# Patient Record
Sex: Female | Born: 1965 | Race: White | Hispanic: No | Marital: Married | Smoking: Former smoker
Health system: Southern US, Community
[De-identification: ages and names within clinical notes are randomized; demographics above are authoritative.]

## PROBLEM LIST (undated history)

## (undated) DIAGNOSIS — R112 Nausea with vomiting, unspecified: Secondary | ICD-10-CM

## (undated) DIAGNOSIS — Z9889 Other specified postprocedural states: Secondary | ICD-10-CM

## (undated) DIAGNOSIS — K219 Gastro-esophageal reflux disease without esophagitis: Secondary | ICD-10-CM

## (undated) DIAGNOSIS — T4145XA Adverse effect of unspecified anesthetic, initial encounter: Secondary | ICD-10-CM

## (undated) DIAGNOSIS — F419 Anxiety disorder, unspecified: Secondary | ICD-10-CM

## (undated) DIAGNOSIS — T8859XA Other complications of anesthesia, initial encounter: Secondary | ICD-10-CM

## (undated) DIAGNOSIS — Z6841 Body Mass Index (BMI) 40.0 and over, adult: Secondary | ICD-10-CM

## (undated) HISTORY — DX: Morbid (severe) obesity due to excess calories: E66.01

## (undated) HISTORY — DX: Body Mass Index (BMI) 40.0 and over, adult: Z684

## (undated) HISTORY — DX: Anxiety disorder, unspecified: F41.9

---

## 1998-05-05 ENCOUNTER — Other Ambulatory Visit: Admission: RE | Admit: 1998-05-05 | Discharge: 1998-05-05 | Payer: Self-pay | Admitting: Obstetrics and Gynecology

## 1998-06-02 ENCOUNTER — Inpatient Hospital Stay (HOSPITAL_COMMUNITY): Admission: AD | Admit: 1998-06-02 | Discharge: 1998-06-04 | Payer: Self-pay | Admitting: Obstetrics and Gynecology

## 1998-07-02 ENCOUNTER — Other Ambulatory Visit: Admission: RE | Admit: 1998-07-02 | Discharge: 1998-07-02 | Payer: Self-pay | Admitting: Obstetrics and Gynecology

## 1998-09-16 ENCOUNTER — Other Ambulatory Visit: Admission: RE | Admit: 1998-09-16 | Discharge: 1998-09-16 | Payer: Self-pay | Admitting: Obstetrics and Gynecology

## 1998-10-19 ENCOUNTER — Ambulatory Visit (HOSPITAL_COMMUNITY): Admission: RE | Admit: 1998-10-19 | Discharge: 1998-10-19 | Payer: Self-pay | Admitting: Obstetrics and Gynecology

## 1999-02-11 ENCOUNTER — Other Ambulatory Visit: Admission: RE | Admit: 1999-02-11 | Discharge: 1999-02-11 | Payer: Self-pay | Admitting: Obstetrics and Gynecology

## 1999-06-17 ENCOUNTER — Other Ambulatory Visit: Admission: RE | Admit: 1999-06-17 | Discharge: 1999-06-17 | Payer: Self-pay | Admitting: Obstetrics and Gynecology

## 1999-10-31 ENCOUNTER — Other Ambulatory Visit: Admission: RE | Admit: 1999-10-31 | Discharge: 1999-10-31 | Payer: Self-pay | Admitting: Obstetrics and Gynecology

## 2000-02-23 ENCOUNTER — Other Ambulatory Visit: Admission: RE | Admit: 2000-02-23 | Discharge: 2000-02-23 | Payer: Self-pay | Admitting: Obstetrics and Gynecology

## 2000-07-06 ENCOUNTER — Other Ambulatory Visit: Admission: RE | Admit: 2000-07-06 | Discharge: 2000-07-06 | Payer: Self-pay | Admitting: Obstetrics and Gynecology

## 2000-11-06 ENCOUNTER — Other Ambulatory Visit: Admission: RE | Admit: 2000-11-06 | Discharge: 2000-11-06 | Payer: Self-pay | Admitting: Obstetrics and Gynecology

## 2001-05-09 ENCOUNTER — Other Ambulatory Visit: Admission: RE | Admit: 2001-05-09 | Discharge: 2001-05-09 | Payer: Self-pay | Admitting: Obstetrics and Gynecology

## 2001-11-07 ENCOUNTER — Other Ambulatory Visit: Admission: RE | Admit: 2001-11-07 | Discharge: 2001-11-07 | Payer: Self-pay | Admitting: Obstetrics and Gynecology

## 2002-11-12 ENCOUNTER — Other Ambulatory Visit: Admission: RE | Admit: 2002-11-12 | Discharge: 2002-11-12 | Payer: Self-pay | Admitting: Obstetrics and Gynecology

## 2006-11-12 ENCOUNTER — Inpatient Hospital Stay (HOSPITAL_COMMUNITY): Admission: AD | Admit: 2006-11-12 | Discharge: 2006-11-14 | Payer: Self-pay | Admitting: Obstetrics and Gynecology

## 2006-11-15 ENCOUNTER — Encounter: Admission: RE | Admit: 2006-11-15 | Discharge: 2006-12-13 | Payer: Self-pay | Admitting: Obstetrics and Gynecology

## 2006-12-14 ENCOUNTER — Encounter: Admission: RE | Admit: 2006-12-14 | Discharge: 2006-12-31 | Payer: Self-pay | Admitting: Obstetrics and Gynecology

## 2008-12-29 ENCOUNTER — Encounter: Admission: RE | Admit: 2008-12-29 | Discharge: 2008-12-29 | Payer: Self-pay | Admitting: Obstetrics and Gynecology

## 2009-12-31 ENCOUNTER — Encounter: Admission: RE | Admit: 2009-12-31 | Discharge: 2009-12-31 | Payer: Self-pay | Admitting: Obstetrics and Gynecology

## 2010-01-05 ENCOUNTER — Encounter: Admission: RE | Admit: 2010-01-05 | Discharge: 2010-01-05 | Payer: Self-pay | Admitting: Obstetrics and Gynecology

## 2010-01-07 HISTORY — PX: TUBAL LIGATION: SHX77

## 2010-02-18 ENCOUNTER — Ambulatory Visit (HOSPITAL_COMMUNITY): Admission: RE | Admit: 2010-02-18 | Discharge: 2010-02-18 | Payer: Self-pay | Admitting: Obstetrics and Gynecology

## 2010-06-09 ENCOUNTER — Encounter: Admission: RE | Admit: 2010-06-09 | Discharge: 2010-06-09 | Payer: Self-pay | Admitting: Obstetrics and Gynecology

## 2010-12-02 ENCOUNTER — Other Ambulatory Visit: Payer: Self-pay | Admitting: Obstetrics and Gynecology

## 2010-12-02 DIAGNOSIS — Z1231 Encounter for screening mammogram for malignant neoplasm of breast: Secondary | ICD-10-CM

## 2010-12-02 DIAGNOSIS — Z09 Encounter for follow-up examination after completed treatment for conditions other than malignant neoplasm: Secondary | ICD-10-CM

## 2010-12-02 DIAGNOSIS — D249 Benign neoplasm of unspecified breast: Secondary | ICD-10-CM

## 2010-12-27 LAB — HCG, SERUM, QUALITATIVE: Preg, Serum: NEGATIVE

## 2010-12-27 LAB — CBC
MCV: 91 fL (ref 78.0–100.0)
RBC: 4.39 MIL/uL (ref 3.87–5.11)

## 2011-01-03 ENCOUNTER — Other Ambulatory Visit: Payer: Self-pay

## 2011-02-24 NOTE — H&P (Signed)
[name], [name]            ACCOUNT NO.:  [id number]   MEDICAL RECORD NO.:  [id number]          PATIENT TYPE:  INP   LOCATION:  9198                          FACILITY:  [facility]   PHYSICIAN:  Lenoard Aden, M.D.DATE OF BIRTH:  1966/10/08   DATE OF ADMISSION:  11/12/2006  DATE OF DISCHARGE:                              HISTORY & PHYSICAL   CHIEF COMPLAINT:  Induction for presumed LGA.   She is a 45 year old white female, G2, P49, EDD November 19, 2006, at 12  weeks' gestation, for induction as noted.   PAST MEDICAL HISTORY:  Remarkable for no known drug allergies.   Medications are prenatal vitamins.   She is a nonsmoker, nondrinker.  She denies domestic or physical  violence.  She has a history of HPV, UTI and tobacco abuse.   FAMILY HISTORY:  Remarkable for diabetes, multiple sclerosis, kidney  stones, colon cancer.   She has a history of one uncomplicated vaginal delivery of an 8 pound 6  ounce child in 1999.  History of one abortion and one miscarriage.   The prenatal course is complicated by size-dates discrepancy and  probable LGA.   PHYSICAL EXAMINATION:  GENERAL:  She is a well-developed, well-nourished  white female in no acute distress.  HEENT:  Normal.  LUNGS:  Clear.  HEART:  Regular rhythm.  ABDOMEN:  Soft, gravid, nontender.  Estimated fetal weight 8-9 pounds.  PELVIC:  Cervix is 3-4 cm, 50, vertex and -2.  EXTREMITIES:  Without cords.  NEUROLOGIC:  Nonfocal.   IMPRESSION:  1. Thirty-nine week intrauterine pregnancy.  2. Favorable cervix.  3. Presumed large for gestational age.   PLAN:  Proceed with induction.  The risks, benefits discussed.      Lenoard Aden, M.D.  Electronically Signed     RJT/MEDQ  D:  11/12/2006  T:  11/12/2006  Job:  [id number]

## 2011-03-09 ENCOUNTER — Ambulatory Visit
Admission: RE | Admit: 2011-03-09 | Discharge: 2011-03-09 | Disposition: A | Discharge status: Home / Self Care | Payer: 59 | Source: Ambulatory Visit | Attending: Obstetrics and Gynecology | Admitting: Obstetrics and Gynecology

## 2011-03-09 DIAGNOSIS — Z09 Encounter for follow-up examination after completed treatment for conditions other than malignant neoplasm: Secondary | ICD-10-CM

## 2011-03-09 DIAGNOSIS — N6320 Unspecified lump in the left breast, unspecified quadrant: Secondary | ICD-10-CM

## 2011-03-09 DIAGNOSIS — D249 Benign neoplasm of unspecified breast: Secondary | ICD-10-CM

## 2012-05-02 ENCOUNTER — Other Ambulatory Visit: Payer: Self-pay | Admitting: Obstetrics and Gynecology

## 2012-05-02 DIAGNOSIS — N63 Unspecified lump in unspecified breast: Secondary | ICD-10-CM

## 2012-05-09 ENCOUNTER — Other Ambulatory Visit: Payer: 59

## 2012-05-14 ENCOUNTER — Ambulatory Visit
Admission: RE | Admit: 2012-05-14 | Discharge: 2012-05-14 | Disposition: A | Discharge status: Home / Self Care | Payer: 59 | Source: Ambulatory Visit | Attending: Obstetrics and Gynecology | Admitting: Obstetrics and Gynecology

## 2012-05-14 DIAGNOSIS — N63 Unspecified lump in unspecified breast: Secondary | ICD-10-CM

## 2012-10-30 ENCOUNTER — Encounter (INDEPENDENT_AMBULATORY_CARE_PROVIDER_SITE_OTHER): Payer: Self-pay | Admitting: General Surgery

## 2012-10-30 ENCOUNTER — Ambulatory Visit (INDEPENDENT_AMBULATORY_CARE_PROVIDER_SITE_OTHER): Payer: 59 | Admitting: General Surgery

## 2012-10-30 VITALS — BP 130/84 | HR 101 | Temp 97.6°F | Resp 16 | Ht 64.0 in | Wt 270.6 lb

## 2012-10-30 DIAGNOSIS — E669 Obesity, unspecified: Secondary | ICD-10-CM

## 2012-10-30 DIAGNOSIS — Z6841 Body Mass Index (BMI) 40.0 and over, adult: Secondary | ICD-10-CM

## 2012-10-30 DIAGNOSIS — F419 Anxiety disorder, unspecified: Secondary | ICD-10-CM

## 2012-10-30 NOTE — Progress Notes (Signed)
Patient ID: [name], female   DOB: 1966-05-24, 47 y.o.   MRN: [id number]  Chief Complaint  Patient presents with  . Weight Loss Surgery    Gastric Sleeve Initial    HPI [name] is a 47 y.o. female.  This patient presents for her initial weight loss surgery evaluation. She has attended our information sessions and is interested in the sleeve gastrectomy.she has a BMI of 46 with anxiety and depression. She says that she has struggled to wait "forever". She says that she has tried several diets including Weight Watchers Atkins diet, phentermine, [employer], and was most effective with the [employer] she lost 70 pounds but regained the weight after quitting smoking about 7-8 years ago. She says she is interested in with surgery to be healthy. She has always been entered in doing this and has actually looked at symmetrical to resume options because she did not know that her insurance covered this. She says that she has some occasional heartburn which is very about once per week or once every other week which responds to taking TUMS. HPI  Past Medical History  Diagnosis Date  . Morbid obesity   . Body mass index 45.0-49.9, adult   . Anxiety     Past Surgical History  Procedure Date  . Tubal ligation     No family history on file.  Social History History  Substance Use Topics  . Smoking status: Former Smoker    Start date: 10/31/2003  . Smokeless tobacco: Not on file  . Alcohol Use: Not on file    No Known Allergies  Current Outpatient Prescriptions  Medication Sig Dispense Refill  . ALPRAZolam (XANAX) 0.5 MG tablet Take 0.5 mg by mouth 2 times daily at 12 noon and 4 pm.      . buPROPion (WELLBUTRIN SR) 150 MG 12 hr tablet Take 150 mg by mouth 2 (two) times daily.        Review of Systems Review of Systems All other review of systems negative or noncontributory except as stated in the HPI  Blood pressure 130/84, pulse 101, temperature 97.6 F (36.4  C), temperature source Temporal, resp. rate 16, height 5\' 4"  (1.626 m), weight 270 lb 9.6 oz (122.743 kg).  Physical Exam Physical Exam Physical Exam  Nursing note and vitals reviewed. Constitutional: She is oriented to person, place, and time. She appears well-developed and well-nourished. No distress.  HENT:  Head: Normocephalic and atraumatic.  Mouth/Throat: No oropharyngeal exudate.  Eyes: Conjunctivae and EOM are normal. Pupils are equal, round, and reactive to light. Right eye exhibits no discharge. Left eye exhibits no discharge. No scleral icterus.  Neck: Normal range of motion. Neck supple. No tracheal deviation present.  Cardiovascular: Normal rate, regular rhythm, normal heart sounds and intact distal pulses.   Pulmonary/Chest: Effort normal and breath sounds normal. No stridor. No respiratory distress. She has no wheezes.  Abdominal: Soft. Bowel sounds are normal. She exhibits no distension and no mass. There is no tenderness. There is no rebound and no guarding.  Musculoskeletal: Normal range of motion. She exhibits no edema and no tenderness.  Neurological: She is alert and oriented to person, place, and time.  Skin: Skin is warm and dry. No rash noted. She is not diaphoretic. No erythema. No pallor.  Psychiatric: She has a normal mood and affect. Her behavior is normal. Judgment and thought content normal.    Data Reviewed   Assessment    Morbid obesity with  a BMI of 46 and comorbidities of depression and anxiety and reflux With a long discussion regarding all of the surgical and nonsurgical options for weight loss and we discussed the options for lap band, sleeve gastrectomy, and Roux-en-Y gastric bypass. I think that she be a fine candidate for any procedures that she desires. She does have very rare heartburn but I think that this is minimal enough that she would be a fine candidate for sleeve gastrectomy. We did discuss the possibility of worsening reflux and possibly  requiring conversion to gastric bypass and she expressed understanding of this. We discussed the pros and cons of each option as well as the risks of sleeve gastrectomy. .The risks of infection, bleeding, pain, scarring, weight regain, too little or too much weight loss, vitamin deficiencies and need for lifelong vitamin supplementation, hair loss, need for protein supplementation, leaks, stricture, reflux, food intolerance, need for reoperation and conversion to roux Y gastric bypass, need for open surgery, injury to spleen or surrounding structures, DVT's, PE, and death again discussed with the patient and the patient expressed understanding and desires to proceed with laparoscopic vertical sleeve gastrectomy.    Plan    We will go ahead and start her with the nutrition labs an upper GI as well as Psychology and nutrition evaluations and she will followup after this I       Felecia Stanfill DAVID 10/30/2012, 11:36 AM

## 2012-10-31 ENCOUNTER — Ambulatory Visit (HOSPITAL_COMMUNITY)
Admission: RE | Admit: 2012-10-31 | Discharge: 2012-10-31 | Disposition: A | Discharge status: Home / Self Care | Payer: 59 | Source: Ambulatory Visit | Attending: General Surgery | Admitting: General Surgery

## 2012-10-31 ENCOUNTER — Encounter (HOSPITAL_COMMUNITY): Payer: Self-pay

## 2012-10-31 DIAGNOSIS — F3289 Other specified depressive episodes: Secondary | ICD-10-CM | POA: Insufficient documentation

## 2012-10-31 DIAGNOSIS — K449 Diaphragmatic hernia without obstruction or gangrene: Secondary | ICD-10-CM | POA: Insufficient documentation

## 2012-10-31 DIAGNOSIS — K219 Gastro-esophageal reflux disease without esophagitis: Secondary | ICD-10-CM | POA: Insufficient documentation

## 2012-10-31 DIAGNOSIS — E669 Obesity, unspecified: Secondary | ICD-10-CM

## 2012-10-31 DIAGNOSIS — F329 Major depressive disorder, single episode, unspecified: Secondary | ICD-10-CM | POA: Insufficient documentation

## 2012-10-31 DIAGNOSIS — Z6841 Body Mass Index (BMI) 40.0 and over, adult: Secondary | ICD-10-CM | POA: Insufficient documentation

## 2012-10-31 DIAGNOSIS — F419 Anxiety disorder, unspecified: Secondary | ICD-10-CM

## 2012-10-31 DIAGNOSIS — F411 Generalized anxiety disorder: Secondary | ICD-10-CM | POA: Insufficient documentation

## 2012-11-06 ENCOUNTER — Encounter: Payer: Self-pay | Admitting: *Deleted

## 2012-11-06 ENCOUNTER — Encounter: Payer: 59 | Attending: General Surgery | Admitting: *Deleted

## 2012-11-06 DIAGNOSIS — Z9884 Bariatric surgery status: Secondary | ICD-10-CM | POA: Insufficient documentation

## 2012-11-06 DIAGNOSIS — Z01818 Encounter for other preprocedural examination: Secondary | ICD-10-CM | POA: Insufficient documentation

## 2012-11-06 DIAGNOSIS — Z713 Dietary counseling and surveillance: Secondary | ICD-10-CM | POA: Insufficient documentation

## 2012-11-06 LAB — CBC WITH DIFFERENTIAL/PLATELET
Eosinophils Absolute: 0.1 10*3/uL (ref 0.0–0.7)
Hemoglobin: 13.5 g/dL (ref 12.0–15.0)
Lymphocytes Relative: 20 % (ref 12–46)
Lymphs Abs: 1.6 10*3/uL (ref 0.7–4.0)
MCH: 29.1 pg (ref 26.0–34.0)
MCHC: 33.3 g/dL (ref 30.0–36.0)
MCV: 87.5 fL (ref 78.0–100.0)
Monocytes Relative: 6 % (ref 3–12)
Neutrophils Relative %: 73 % (ref 43–77)
Platelets: 364 10*3/uL (ref 150–400)
RBC: 4.64 MIL/uL (ref 3.87–5.11)
WBC: 7.6 10*3/uL (ref 4.0–10.5)

## 2012-11-06 LAB — LIPID PANEL
Cholesterol: 201 mg/dL — ABNORMAL HIGH (ref 0–200)
LDL Cholesterol: 121 mg/dL — ABNORMAL HIGH (ref 0–99)
Total CHOL/HDL Ratio: 4.2 Ratio
VLDL: 32 mg/dL (ref 0–40)

## 2012-11-06 LAB — COMPREHENSIVE METABOLIC PANEL
Albumin: 4 g/dL (ref 3.5–5.2)
Alkaline Phosphatase: 96 U/L (ref 39–117)
BUN: 14 mg/dL (ref 6–23)
CO2: 22 mEq/L (ref 19–32)
Creat: 0.88 mg/dL (ref 0.50–1.10)
Sodium: 140 mEq/L (ref 135–145)
Total Bilirubin: 0.5 mg/dL (ref 0.3–1.2)
Total Protein: 7.3 g/dL (ref 6.0–8.3)

## 2012-11-06 NOTE — Patient Instructions (Addendum)
   Follow Pre-Op Nutrition Goals to prepare for Gastric Sleeve Surgery.   Call the [facility] at [phone] once you have been given your surgery date to enrolled in the Pre-Op Nutrition Class. You will need to attend this nutrition class 3-4 weeks prior to your surgery.  

## 2012-11-06 NOTE — Progress Notes (Signed)
  Pre-Op Assessment Visit:  Pre-Operative Gastric Sleeve Surgery  Medical Nutrition Therapy:  Appt start time:  1130   End time:  1230.  Patient was seen on 11/06/2012 for Pre-Operative Gastric Sleeve Nutrition Assessment. Assessment and letter of approval faxed to [facility] Bariatric Surgery Program coordinator on 11/07/12.  Approval letter sent to [facility] and will be available in the chart under the media tab.  Handouts given during visit include:  Pre-Op Goals   Bariatric Surgery Protein Shakes  Patient to call for Pre-Op and Post-Op Nutrition Education at the [facility] when surgery is scheduled.

## 2012-11-07 ENCOUNTER — Encounter: Payer: Self-pay | Admitting: *Deleted

## 2012-11-07 LAB — TSH: TSH: 0.813 u[IU]/mL (ref 0.350–4.500)

## 2012-11-07 LAB — H. PYLORI ANTIBODY, IGG: H Pylori IgG: 0.59 {ISR}

## 2012-11-25 ENCOUNTER — Other Ambulatory Visit (INDEPENDENT_AMBULATORY_CARE_PROVIDER_SITE_OTHER): Payer: Self-pay | Admitting: General Surgery

## 2012-12-26 ENCOUNTER — Encounter: Payer: 59 | Attending: General Surgery | Admitting: *Deleted

## 2012-12-26 DIAGNOSIS — Z9884 Bariatric surgery status: Secondary | ICD-10-CM | POA: Insufficient documentation

## 2012-12-26 DIAGNOSIS — Z01818 Encounter for other preprocedural examination: Secondary | ICD-10-CM | POA: Insufficient documentation

## 2012-12-26 DIAGNOSIS — Z713 Dietary counseling and surveillance: Secondary | ICD-10-CM | POA: Insufficient documentation

## 2012-12-27 ENCOUNTER — Encounter: Payer: Self-pay | Admitting: *Deleted

## 2012-12-27 NOTE — Patient Instructions (Addendum)
Follow:   Pre-Op Diet per MD 2 weeks prior to surgery  Phase 2- Liquids (clear/full) 2 weeks after surgery  Vitamin/Mineral/Calcium guidelines for purchasing bariatric supplements  Exercise guidelines pre and post-op per MD  Follow-up at [facility] in 2 weeks post-op for diet advancement. Contact Aemilia Dedrick as needed with questions/concerns. 

## 2012-12-27 NOTE — Progress Notes (Signed)
Bariatric Class:  Appt start time: 0900 end time:  1000.  Pre-Operative Nutrition Class  Patient was seen on 12/26/12 for Pre-Operative Bariatric Surgery Education at the [facility].   Surgery date: 01/13/13 Surgery type: Gastric Sleeve Start weight at [facility]: 270.6 lbs (11/06/12)  Weight today: 269.5 lbs Weight change: 1.1 lbs Total weight lost: 1.1 lbs  TANITA  BODY COMP RESULTS  12/26/12   BMI (kg/m^2) 47.7   Fat Mass (lbs) 146.5   Fat Free Mass (lbs) 123.0   Total Body Water (lbs) 90.0   Samples given per MNT protocol: Lot # [id number] Exp: 10/15  Bariatric Advantage Sublingual B12 Lot # [id number] Exp: 10/15  Celebrate Vitamins Multivitamin Lot # [id number] Exp: 11/14  Celebrate Vitamins Calcium Citrate Lot # [id number] Exp: 09/15  Corliss [employer] Protein Powder Lot # [id number] Exp: 03/15  Premier Protein Shake Lot # [id number] Exp: 08/16/13  The following the learning objective met by the patient during this course:  Identifies Pre-Op Dietary Goals and will begin 2 weeks pre-operatively  Identifies appropriate sources of fluids and proteins   States protein recommendations and appropriate sources pre and post-operatively  Identifies Post-Operative Dietary Goals and will follow for 2 weeks post-operatively  Identifies appropriate multivitamin and calcium sources  Describes the need for physical activity post-operatively and will follow MD recommendations  States when to call healthcare provider regarding medication questions or post-operative complications  Handouts given during class include:  Pre-Op Bariatric Surgery Diet Handout  Protein Shake Handout  Post-Op Bariatric Surgery Nutrition Handout  [facility]ELT Program Information Flyer  Support Group Information Flyer  [facility] Bariatric Supplements Price List  Follow-Up Plan: Patient will follow-up at [facility] 2 weeks post operatively for diet advancement per MD.

## 2012-12-31 ENCOUNTER — Encounter (HOSPITAL_COMMUNITY): Payer: Self-pay | Admitting: Pharmacy Technician

## 2013-01-01 ENCOUNTER — Ambulatory Visit (INDEPENDENT_AMBULATORY_CARE_PROVIDER_SITE_OTHER): Payer: 59 | Admitting: General Surgery

## 2013-01-01 ENCOUNTER — Encounter (INDEPENDENT_AMBULATORY_CARE_PROVIDER_SITE_OTHER): Payer: Self-pay | Admitting: General Surgery

## 2013-01-01 DIAGNOSIS — Z6841 Body Mass Index (BMI) 40.0 and over, adult: Secondary | ICD-10-CM

## 2013-01-01 DIAGNOSIS — F411 Generalized anxiety disorder: Secondary | ICD-10-CM

## 2013-01-01 NOTE — Progress Notes (Signed)
Patient ID: [name], female   DOB: 09/10/1966, 47 y.o.   MRN: [id number]  Chief Complaint  Patient presents with  . Bariatric Pre-op    HPI [name] is a 47 y.o. female.  This patient is here for her preoperative evaluation and preparation for laparoscopic vertical sleeve gastrectomy.   She weighs 270 pounds and has obesity-related comorbidities of anxiety and depression. She has completed her preoperative workup and remains interested in a vertical sleeve gastrectomy. Of note, she did have a small to moderate sliding-type hiatal hernia noted on her upper GI. HPI  Past Medical History  Diagnosis Date  . Morbid obesity   . Body mass index 45.0-49.9, adult   . Anxiety     Past Surgical History  Procedure Laterality Date  . Tubal ligation  01/2010    History reviewed. No pertinent family history.  Social History History  Substance Use Topics  . Smoking status: Former Smoker    Quit date: 10/31/2003  . Smokeless tobacco: Not on file  . Alcohol Use: No    No Known Allergies  Current Outpatient Prescriptions  Medication Sig Dispense Refill  . acetaminophen (TYLENOL) 500 MG tablet Take 500 mg by mouth every 6 (six) hours as needed for pain.      . ALPRAZolam (XANAX) 0.5 MG tablet Take 0.5 mg by mouth 2 (two) times daily as needed for anxiety.       . buPROPion (WELLBUTRIN XL) 150 MG 24 hr tablet Take 150 mg by mouth daily before breakfast.      . ibuprofen (ADVIL,MOTRIN) 200 MG tablet Take 600 mg by mouth every 6 (six) hours as needed for pain or headache.       No current facility-administered medications for this visit.    Review of Systems Review of Systems All other review of systems negative or noncontributory except as stated in the HPI  Blood pressure 146/82, pulse 83, temperature 97.2 F (36.2 C), temperature source Temporal, resp. rate 16, height 5' 3" (1.6 m), weight 270 lb 6.4 oz (122.653 kg).  Physical Exam Physical Exam Physical Exam   Nursing note and vitals reviewed. Constitutional: She is oriented to person, place, and time. She appears well-developed and well-nourished. No distress.  HENT:  Head: Normocephalic and atraumatic.  Mouth/Throat: No oropharyngeal exudate.  Eyes: Conjunctivae and EOM are normal. Pupils are equal, round, and reactive to light. Right eye exhibits no discharge. Left eye exhibits no discharge. No scleral icterus.  Neck: Normal range of motion. Neck supple. No tracheal deviation present.  Cardiovascular: Normal rate, regular rhythm, normal heart sounds and intact distal pulses.   Pulmonary/Chest: Effort normal and breath sounds normal. No stridor. No respiratory distress. She has no wheezes.  Abdominal: Soft. Bowel sounds are normal. She exhibits no distension and no mass. There is no tenderness. There is no rebound and no guarding.  Musculoskeletal: Normal range of motion. She exhibits no edema and no tenderness.  Neurological: She is alert and oriented to person, place, and time.  Skin: Skin is warm and dry. No rash noted. She is not diaphoretic. No erythema. No pallor.  Psychiatric: She has a normal mood and affect. Her behavior is normal. Judgment and thought content normal.    Data Reviewed   Assessment    Morbid obesity with obesity related comorbidities of anxiety and depression We again discussed the surgical weight loss procedures and her options. She remains interested in the vertical sleeve gastrectomy. We discussed the surgery and its   risks. The risks of infection, bleeding, pain, scarring, weight regain, too little or too much weight loss, vitamin deficiencies and need for lifelong vitamin supplementation, hair loss, need for protein supplementation, leaks, stricture, reflux, food intolerance, need for reoperation and conversion to roux Y gastric bypass, need for open surgery, injury to spleen or surrounding structures, DVT's, PE, and death again discussed with the patient and the  patient expressed understanding and desires to proceed with laparoscopic vertical sleeve gastrectomy, possible open, intraoperative endoscopy. We again discussed the possibility of worsening reflux. We also talked about the perioperative course and expectations and she again would like to proceed with vertical sleeve gastrectomy      Plan    We will proceed with surgery as already scheduled        Lyrik Dockstader DAVID 01/01/2013, 5:39 PM    

## 2013-01-07 ENCOUNTER — Encounter (HOSPITAL_COMMUNITY): Payer: Self-pay

## 2013-01-07 ENCOUNTER — Other Ambulatory Visit (HOSPITAL_COMMUNITY): Payer: Self-pay | Admitting: *Deleted

## 2013-01-07 ENCOUNTER — Encounter (HOSPITAL_COMMUNITY)
Admission: RE | Admit: 2013-01-07 | Discharge: 2013-01-07 | Disposition: A | Discharge status: Home / Self Care | Payer: 59 | Source: Ambulatory Visit | Attending: General Surgery | Admitting: General Surgery

## 2013-01-07 HISTORY — DX: Other specified postprocedural states: Z98.890

## 2013-01-07 HISTORY — DX: Gastro-esophageal reflux disease without esophagitis: K21.9

## 2013-01-07 HISTORY — DX: Adverse effect of unspecified anesthetic, initial encounter: T41.45XA

## 2013-01-07 HISTORY — DX: Other specified postprocedural states: R11.2

## 2013-01-07 HISTORY — DX: Other complications of anesthesia, initial encounter: T88.59XA

## 2013-01-07 LAB — COMPREHENSIVE METABOLIC PANEL
AST: 17 U/L (ref 0–37)
Albumin: 3.9 g/dL (ref 3.5–5.2)
Chloride: 103 mEq/L (ref 96–112)
Creatinine, Ser: 0.81 mg/dL (ref 0.50–1.10)
Potassium: 4.1 mEq/L (ref 3.5–5.1)
Total Bilirubin: 0.4 mg/dL (ref 0.3–1.2)
Total Protein: 8 g/dL (ref 6.0–8.3)

## 2013-01-07 LAB — CBC WITH DIFFERENTIAL/PLATELET
Basophils Absolute: 0 10*3/uL (ref 0.0–0.1)
Basophils Relative: 0 % (ref 0–1)
Eosinophils Absolute: 0.1 10*3/uL (ref 0.0–0.7)
MCHC: 33.4 g/dL (ref 30.0–36.0)
Monocytes Absolute: 0.5 10*3/uL (ref 0.1–1.0)
Neutro Abs: 4.8 10*3/uL (ref 1.7–7.7)
Neutrophils Relative %: 69 % (ref 43–77)
RDW: 13.6 % (ref 11.5–15.5)

## 2013-01-07 LAB — SURGICAL PCR SCREEN: Staphylococcus aureus: POSITIVE — AB

## 2013-01-07 NOTE — Patient Instructions (Signed)
[name]  01/07/2013                           YOUR PROCEDURE IS SCHEDULED ON: 01/13/13               PLEASE REPORT TO SHORT STAY CENTER AT : 8:30 am               CALL THIS NUMBER IF ANY PROBLEMS THE DAY OF SURGERY :               [phone]                      REMEMBER:   Do not eat food or drink liquids AFTER MIDNIGHT   Take these medicines the morning of surgery with A SIP OF WATER: WELLBUTRIN / XANAX IF NEEDED   Do not wear jewelry, make-up   Do not wear lotions, powders, or perfumes.   Do not shave legs or underarms 12 hrs. before surgery (men may shave face)  Do not bring valuables to the hospital.  Contacts, dentures or bridgework may not be worn into surgery.  Leave suitcase in the car. After surgery it may be brought to your room.  For patients admitted to the hospital more than one night, checkout time is 11:00                          The day of discharge.   Patients discharged the day of surgery will not be allowed to drive home                             If going home same day of surgery, must have someone stay with you first                           24 hrs at home and arrange for some one to drive you home from hospital.    Special Instructions:   Please read over the following fact sheets that you were given:               1. MRSA  INFORMATION                      2. [facility] PREPARING FOR SURGERY SHEET                                                X_____________________________________________________________________        Failure to follow these instructions may result in cancellation of your surgery

## 2013-01-13 ENCOUNTER — Encounter (HOSPITAL_COMMUNITY): Payer: Self-pay | Admitting: Anesthesiology

## 2013-01-13 ENCOUNTER — Inpatient Hospital Stay (HOSPITAL_COMMUNITY)
Admission: RE | Admit: 2013-01-13 | Discharge: 2013-01-15 | DRG: 621 | Disposition: A | Discharge status: Home / Self Care | Payer: 59 | Source: Ambulatory Visit | Attending: General Surgery | Admitting: General Surgery

## 2013-01-13 ENCOUNTER — Inpatient Hospital Stay (HOSPITAL_COMMUNITY): Payer: 59 | Admitting: Anesthesiology

## 2013-01-13 ENCOUNTER — Encounter (HOSPITAL_COMMUNITY)
Admission: RE | Disposition: A | Discharge status: Home / Self Care | Payer: Self-pay | Source: Ambulatory Visit | Attending: General Surgery

## 2013-01-13 ENCOUNTER — Encounter (HOSPITAL_COMMUNITY): Payer: Self-pay

## 2013-01-13 DIAGNOSIS — F3289 Other specified depressive episodes: Secondary | ICD-10-CM | POA: Diagnosis present

## 2013-01-13 DIAGNOSIS — Z6841 Body Mass Index (BMI) 40.0 and over, adult: Secondary | ICD-10-CM

## 2013-01-13 DIAGNOSIS — K449 Diaphragmatic hernia without obstruction or gangrene: Secondary | ICD-10-CM | POA: Diagnosis present

## 2013-01-13 DIAGNOSIS — F329 Major depressive disorder, single episode, unspecified: Secondary | ICD-10-CM | POA: Diagnosis present

## 2013-01-13 DIAGNOSIS — Z79899 Other long term (current) drug therapy: Secondary | ICD-10-CM

## 2013-01-13 DIAGNOSIS — F411 Generalized anxiety disorder: Secondary | ICD-10-CM | POA: Diagnosis present

## 2013-01-13 DIAGNOSIS — Z87891 Personal history of nicotine dependence: Secondary | ICD-10-CM

## 2013-01-13 HISTORY — PX: LAPAROSCOPIC GASTRIC SLEEVE RESECTION: SHX5895

## 2013-01-13 HISTORY — PX: ESOPHAGOGASTRODUODENOSCOPY: SHX5428

## 2013-01-13 HISTORY — PX: HIATAL HERNIA REPAIR: SHX195

## 2013-01-13 SURGERY — GASTRECTOMY, SLEEVE, LAPAROSCOPIC
Anesthesia: General | Site: Esophagus | Wound class: Clean Contaminated

## 2013-01-13 MED ORDER — UNJURY CHOCOLATE CLASSIC POWDER
2.0000 [oz_av] | Freq: Four times a day (QID) | ORAL | Status: DC
Start: 2013-01-15 — End: 2013-01-15

## 2013-01-13 MED ORDER — BUPIVACAINE HCL 0.25 % IJ SOLN
INTRAMUSCULAR | Status: DC | PRN
  Administered 2013-01-13: 20 mL

## 2013-01-13 MED ORDER — ROCURONIUM BROMIDE 100 MG/10ML IV SOLN
INTRAVENOUS | Status: DC | PRN
  Administered 2013-01-13 (×2): 10 mg via INTRAVENOUS
  Administered 2013-01-13: 50 mg via INTRAVENOUS

## 2013-01-13 MED ORDER — HYDROMORPHONE HCL PF 1 MG/ML IJ SOLN: 0.2500 mg | INTRAMUSCULAR | Status: DC | PRN

## 2013-01-13 MED ORDER — LACTATED RINGERS IV SOLN
INTRAVENOUS | Status: DC | PRN
  Administered 2013-01-13 (×2): via INTRAVENOUS

## 2013-01-13 MED ORDER — ACETAMINOPHEN 10 MG/ML IV SOLN
INTRAVENOUS | Status: DC | PRN
  Administered 2013-01-13: 1000 mg via INTRAVENOUS

## 2013-01-13 MED ORDER — FENTANYL CITRATE 0.05 MG/ML IJ SOLN
INTRAMUSCULAR | Status: DC | PRN
  Administered 2013-01-13 (×2): 50 ug via INTRAVENOUS
  Administered 2013-01-13: 150 ug via INTRAVENOUS

## 2013-01-13 MED ORDER — PROPOFOL 10 MG/ML IV BOLUS
INTRAVENOUS | Status: DC | PRN
  Administered 2013-01-13: 200 mg via INTRAVENOUS

## 2013-01-13 MED ORDER — ONDANSETRON HCL 4 MG/2ML IJ SOLN
4.0000 mg | INTRAMUSCULAR | Status: DC | PRN
  Administered 2013-01-13: 4 mg via INTRAVENOUS
  Filled 2013-01-13: qty 2

## 2013-01-13 MED ORDER — ENOXAPARIN SODIUM 40 MG/0.4ML ~~LOC~~ SOLN
40.0000 mg | Freq: Two times a day (BID) | SUBCUTANEOUS | Status: DC
  Administered 2013-01-14 – 2013-01-15 (×3): 40 mg via SUBCUTANEOUS
  Filled 2013-01-13 (×5): qty 0.4

## 2013-01-13 MED ORDER — SCOPOLAMINE 1 MG/3DAYS TD PT72
MEDICATED_PATCH | TRANSDERMAL | Status: DC | PRN
  Administered 2013-01-13: 1 via TRANSDERMAL

## 2013-01-13 MED ORDER — DEXAMETHASONE SODIUM PHOSPHATE 10 MG/ML IJ SOLN
INTRAMUSCULAR | Status: DC | PRN
  Administered 2013-01-13: 10 mg via INTRAVENOUS

## 2013-01-13 MED ORDER — KETOROLAC TROMETHAMINE 30 MG/ML IJ SOLN: 15.0000 mg | Freq: Once | INTRAMUSCULAR | Status: DC | PRN

## 2013-01-13 MED ORDER — MIDAZOLAM HCL 5 MG/5ML IJ SOLN
INTRAMUSCULAR | Status: DC | PRN
  Administered 2013-01-13: 2 mg via INTRAVENOUS

## 2013-01-13 MED ORDER — GLYCOPYRROLATE 0.2 MG/ML IJ SOLN
INTRAMUSCULAR | Status: DC | PRN
  Administered 2013-01-13: 0.4 mg via INTRAVENOUS

## 2013-01-13 MED ORDER — ONDANSETRON HCL 4 MG/2ML IJ SOLN
INTRAMUSCULAR | Status: DC | PRN
  Administered 2013-01-13: 4 mg via INTRAVENOUS

## 2013-01-13 MED ORDER — MORPHINE SULFATE 2 MG/ML IJ SOLN: 2.0000 mg | INTRAMUSCULAR | Status: DC | PRN

## 2013-01-13 MED ORDER — LIDOCAINE HCL (CARDIAC) 20 MG/ML IV SOLN
INTRAVENOUS | Status: DC | PRN
  Administered 2013-01-13: 60 mg via INTRAVENOUS

## 2013-01-13 MED ORDER — LIDOCAINE-EPINEPHRINE 1 %-1:100000 IJ SOLN
INTRAMUSCULAR | Status: DC | PRN
  Administered 2013-01-13: 20 mL

## 2013-01-13 MED ORDER — KETOROLAC TROMETHAMINE 30 MG/ML IJ SOLN
30.0000 mg | Freq: Four times a day (QID) | INTRAMUSCULAR | Status: DC | PRN
  Administered 2013-01-13 – 2013-01-14 (×3): 30 mg via INTRAVENOUS
  Filled 2013-01-13 (×3): qty 1

## 2013-01-13 MED ORDER — UNJURY VANILLA POWDER
2.0000 [oz_av] | Freq: Four times a day (QID) | ORAL | Status: DC
  Administered 2013-01-15: 2 [oz_av] via ORAL

## 2013-01-13 MED ORDER — SODIUM CHLORIDE 0.9 % IV SOLN
1.0000 g | INTRAVENOUS | Status: AC
  Administered 2013-01-13: 1 g via INTRAVENOUS

## 2013-01-13 MED ORDER — OXYCODONE-ACETAMINOPHEN 5-325 MG/5ML PO SOLN
5.0000 mL | ORAL | Status: DC | PRN
  Administered 2013-01-14: 5 mL via ORAL
  Filled 2013-01-13: qty 5

## 2013-01-13 MED ORDER — HEPARIN SODIUM (PORCINE) 5000 UNIT/ML IJ SOLN
5000.0000 [IU] | Freq: Once | INTRAMUSCULAR | Status: AC
  Administered 2013-01-13: 5000 [IU] via SUBCUTANEOUS
  Filled 2013-01-13: qty 1

## 2013-01-13 MED ORDER — LACTATED RINGERS IR SOLN
Status: DC | PRN
  Administered 2013-01-13: 3000 mL

## 2013-01-13 MED ORDER — NEOSTIGMINE METHYLSULFATE 1 MG/ML IJ SOLN
INTRAMUSCULAR | Status: DC | PRN
  Administered 2013-01-13: 3 mg via INTRAVENOUS

## 2013-01-13 MED ORDER — TISSEEL VH 10 ML EX KIT
PACK | CUTANEOUS | Status: DC | PRN
  Administered 2013-01-13: 10 mL

## 2013-01-13 MED ORDER — SUCCINYLCHOLINE CHLORIDE 20 MG/ML IJ SOLN
INTRAMUSCULAR | Status: DC | PRN
  Administered 2013-01-13: 100 mg via INTRAVENOUS

## 2013-01-13 MED ORDER — PROMETHAZINE HCL 25 MG/ML IJ SOLN
6.2500 mg | INTRAMUSCULAR | Status: DC | PRN
  Administered 2013-01-13: 6.25 mg via INTRAVENOUS

## 2013-01-13 MED ORDER — HYDROMORPHONE HCL PF 1 MG/ML IJ SOLN
INTRAMUSCULAR | Status: DC | PRN
  Administered 2013-01-13 (×2): 1 mg via INTRAVENOUS

## 2013-01-13 MED ORDER — UNJURY CHICKEN SOUP POWDER: 2.0000 [oz_av] | Freq: Four times a day (QID) | ORAL | Status: DC

## 2013-01-13 MED ORDER — METOCLOPRAMIDE HCL 5 MG/ML IJ SOLN
INTRAMUSCULAR | Status: DC | PRN
  Administered 2013-01-13: 10 mg via INTRAVENOUS

## 2013-01-13 MED ORDER — KCL IN DEXTROSE-NACL 20-5-0.45 MEQ/L-%-% IV SOLN
INTRAVENOUS | Status: DC
  Administered 2013-01-13 (×2): via INTRAVENOUS
  Administered 2013-01-14: 125 mL/h via INTRAVENOUS
  Administered 2013-01-14 (×2): via INTRAVENOUS
  Filled 2013-01-13 (×8): qty 1000

## 2013-01-13 SURGICAL SUPPLY — 65 items
ADH SKN CLS APL DERMABOND .7 (GAUZE/BANDAGES/DRESSINGS)
APL SRG 32X5 SNPLK LF DISP (MISCELLANEOUS) ×3
APPLICATOR COTTON TIP 6IN STRL (MISCELLANEOUS) IMPLANT
APPLIER CLIP ROT 10 11.4 M/L (STAPLE)
APR CLP MED LRG 11.4X10 (STAPLE)
BAG SPEC RTRVL LRG 6X4 10 (ENDOMECHANICALS)
CABLE HIGH FREQUENCY MONO STRZ (ELECTRODE) ×1 IMPLANT
CANISTER SUCTION 2500CC (MISCELLANEOUS) ×4 IMPLANT
CHLORAPREP W/TINT 26ML (MISCELLANEOUS) ×8 IMPLANT
CLIP APPLIE ROT 10 11.4 M/L (STAPLE) IMPLANT
CLOTH BEACON ORANGE TIMEOUT ST (SAFETY) ×4 IMPLANT
DERMABOND ADVANCED (GAUZE/BANDAGES/DRESSINGS)
DERMABOND ADVANCED .7 DNX12 (GAUZE/BANDAGES/DRESSINGS) IMPLANT
DEVICE SUTURE ENDOST 10MM (ENDOMECHANICALS) ×1 IMPLANT
DRAIN CHANNEL 19F RND (DRAIN) ×4 IMPLANT
DRAIN PENROSE 18X1/2 LTX STRL (DRAIN) ×1 IMPLANT
DRAPE LAPAROSCOPIC ABDOMINAL (DRAPES) ×4 IMPLANT
DRAPE UTILITY 15X26 (DRAPE) ×8 IMPLANT
ELECT REM PT RETURN 9FT ADLT (ELECTROSURGICAL) ×4
ELECTRODE REM PT RTRN 9FT ADLT (ELECTROSURGICAL) ×3 IMPLANT
EVACUATOR DRAINAGE 10X20 100CC (DRAIN) ×3 IMPLANT
EVACUATOR SILICONE 100CC (DRAIN) ×8 IMPLANT
FILTER SMOKE EVAC LAPAROSHD (FILTER) ×1 IMPLANT
GLOVE SURG SS PI 7.5 STRL IVOR (GLOVE) ×8 IMPLANT
GOWN STRL NON-REIN LRG LVL3 (GOWN DISPOSABLE) ×4 IMPLANT
GOWN STRL REIN XL XLG (GOWN DISPOSABLE) ×12 IMPLANT
HANDLE STAPLE EGIA 4 XL (STAPLE) ×1 IMPLANT
HOVERMATT SINGLE USE (MISCELLANEOUS) ×4 IMPLANT
KIT BASIN OR (CUSTOM PROCEDURE TRAY) ×4 IMPLANT
MARKER SKIN DUAL TIP RULER LAB (MISCELLANEOUS) ×4 IMPLANT
NDL SPNL 22GX3.5 QUINCKE BK (NEEDLE) ×3 IMPLANT
NEEDLE SPNL 22GX3.5 QUINCKE BK (NEEDLE) ×4 IMPLANT
NS IRRIG 1000ML POUR BTL (IV SOLUTION) ×4 IMPLANT
PENCIL BUTTON HOLSTER BLD 10FT (ELECTRODE) ×4 IMPLANT
POUCH SPECIMEN RETRIEVAL 10MM (ENDOMECHANICALS) IMPLANT
RELOAD EGIA 60 MED/THCK PURPLE (STAPLE) ×24 IMPLANT
RELOAD ENDO STITCH 2.0 (ENDOMECHANICALS) ×8
RELOAD STAPLE 60 BLK XTHK ART (STAPLE) IMPLANT
RELOAD STAPLE 60 MED/THCK ART (STAPLE) IMPLANT
RELOAD SUT SNGL STCH ABSRB 2-0 (ENDOMECHANICALS) IMPLANT
RELOAD TRI 2.0 60 XTHK VAS SUL (STAPLE) ×8 IMPLANT
SCISSORS LAP 5X35 DISP (ENDOMECHANICALS) IMPLANT
SEALANT SURGICAL APPL DUAL CAN (MISCELLANEOUS) ×4 IMPLANT
SET IRRIG TUBING LAPAROSCOPIC (IRRIGATION / IRRIGATOR) ×4 IMPLANT
SHEARS CURVED HARMONIC AC 45CM (MISCELLANEOUS) ×4 IMPLANT
SLEEVE ENDOPATH XCEL 5M (ENDOMECHANICALS) ×12 IMPLANT
SOLUTION ANTI FOG 6CC (MISCELLANEOUS) ×4 IMPLANT
SPONGE GAUZE 4X4 12PLY (GAUZE/BANDAGES/DRESSINGS) IMPLANT
SPONGE LAP 18X18 X RAY DECT (DISPOSABLE) ×4 IMPLANT
SUT ETHILON 2 0 PS N (SUTURE) ×4 IMPLANT
SUT MNCRL AB 4-0 PS2 18 (SUTURE) ×8 IMPLANT
SUT RELOAD ENDO STITCH 2 48X1 (ENDOMECHANICALS) ×6
SUT SURGIDAC NAB ES-9 0 48 120 (SUTURE) ×2 IMPLANT
SUT VIC AB 2-0 SH 27 (SUTURE) ×8
SUT VIC AB 2-0 SH 27X BRD (SUTURE) IMPLANT
SUT VICRYL 0 UR6 27IN ABS (SUTURE) ×4 IMPLANT
SUTURE RELOAD END STTCH 2 48X1 (ENDOMECHANICALS) ×6 IMPLANT
SYR 50ML LL SCALE MARK (SYRINGE) ×4 IMPLANT
TRAY FOLEY CATH 14FRSI W/METER (CATHETERS) ×4 IMPLANT
TRAY LAP CHOLE (CUSTOM PROCEDURE TRAY) ×4 IMPLANT
TROCAR BLADELESS 15MM (ENDOMECHANICALS) ×4 IMPLANT
TROCAR BLADELESS OPT 5 100 (ENDOMECHANICALS) ×4 IMPLANT
TUBING CONNECTING 10 (TUBING) ×4 IMPLANT
TUBING ENDO SMARTCAP (MISCELLANEOUS) ×4 IMPLANT
TUBING FILTER THERMOFLATOR (ELECTROSURGICAL) ×4 IMPLANT

## 2013-01-13 NOTE — Anesthesia Preprocedure Evaluation (Signed)
Anesthesia Evaluation  Patient identified by MRN, date of birth, ID band Patient awake    Reviewed: Allergy & Precautions, H&P , NPO status , Patient's Chart, lab work & pertinent test results  History of Anesthesia Complications (+) PONV  Airway Mallampati: III TM Distance: <3 FB Neck ROM: Full    Dental no notable dental hx. (+) Dental Advisory Given   Pulmonary neg pulmonary ROS,  breath sounds clear to auscultation  + decreased breath sounds      Cardiovascular negative cardio ROS  Rhythm:Regular Rate:Normal     Neuro/Psych Anxiety negative neurological ROS     GI/Hepatic Neg liver ROS,   Endo/Other  Morbid obesity  Renal/GU negative Renal ROS  negative genitourinary   Musculoskeletal negative musculoskeletal ROS (+)   Abdominal   Peds negative pediatric ROS (+)  Hematology negative hematology ROS (+)   Anesthesia Other Findings   Reproductive/Obstetrics negative OB ROS                           Anesthesia Physical Anesthesia Plan  ASA: III  Anesthesia Plan: General   Post-op Pain Management:    Induction: Intravenous  Airway Management Planned: Oral ETT  Additional Equipment:   Intra-op Plan:   Post-operative Plan: Extubation in OR  Informed Consent: I have reviewed the patients History and Physical, chart, labs and discussed the procedure including the risks, benefits and alternatives for the proposed anesthesia with the patient or authorized representative who has indicated his/her understanding and acceptance.   Dental advisory given  Plan Discussed with: CRNA and Surgeon  Anesthesia Plan Comments:         Anesthesia Quick Evaluation

## 2013-01-13 NOTE — Anesthesia Postprocedure Evaluation (Signed)
  Anesthesia Post-op Note  Patient: [name]  Procedure(s) Performed: Procedure(s) (LRB): LAPAROSCOPIC GASTRIC SLEEVE RESECTION with hiatal hernia repair (N/A) ESOPHAGOGASTRODUODENOSCOPY (EGD) (N/A) LAPAROSCOPIC REPAIR OF HIATAL HERNIA  Patient Location: PACU  Anesthesia Type: General  Level of Consciousness: awake and alert   Airway and Oxygen Therapy: Patient Spontanous Breathing  Post-op Pain: mild  Post-op Assessment: Post-op Vital signs reviewed, Patient's Cardiovascular Status Stable, Respiratory Function Stable, Patent Airway and No signs of Nausea or vomiting  Last Vitals:  Filed Vitals:   01/13/13 1315  BP: 118/81  Pulse: 80  Temp: 36.8 C  Resp: 20    Post-op Vital Signs: stable   Complications: No apparent anesthesia complications

## 2013-01-13 NOTE — Interval H&P Note (Signed)
History and Physical Interval Note:  01/13/2013 10:11 AM  [name]  has presented today for surgery, with the diagnosis of morbid obesity  The various methods of treatment have been discussed with the patient and family. After consideration of risks, benefits and other options for treatment, the patient has consented to  Procedure(s) with comments: LAPAROSCOPIC GASTRIC SLEEVE RESECTION (N/A) - Laparoscopic Sleeve Gastrectomy with EGD ESOPHAGOGASTRODUODENOSCOPY (EGD) (N/A) as a surgical intervention .  The patient's history has been reviewed, patient examined, no change in status, stable for surgery.  I have reviewed the patient's chart and labs.  Questions were answered to the patient's satisfaction.  Patient was seen and evaluated in the preop area.  Risks of the procedure again discussed in lay terms.  The risks of infection, bleeding, pain, scarring, weight regain, too little or too much weight loss, vitamin deficiencies and need for lifelong vitamin supplementation, hair loss, need for protein supplementation, leaks, stricture, reflux, food intolerance, dysphagia, need for reoperation and conversion to roux Y gastric bypass, need for open surgery, injury to spleen or surrounding structures, DVT's, PE, and death again discussed with the patient and the patient expressed understanding and desires to proceed with laparoscopic vertical sleeve gastrectomy, possible open, intraoperative endoscopy. We again talked about the possibility of reflux and the need for hiatal hernia repair.  She expressed understanding and desires to proceed with lap sleeve gastrectomy, hiatal hernia repair, possible open.    Lodema Pilot DAVID

## 2013-01-13 NOTE — Transfer of Care (Signed)
Immediate Anesthesia Transfer of Care Note  Patient: [name]  Procedure(s) Performed: Procedure(s) with comments: LAPAROSCOPIC GASTRIC SLEEVE RESECTION with hiatal hernia repair (N/A) - Laparoscopic Sleeve Gastrectomy with EGD ESOPHAGOGASTRODUODENOSCOPY (EGD) (N/A) LAPAROSCOPIC REPAIR OF HIATAL HERNIA  Patient Location: PACU  Anesthesia Type:General  Level of Consciousness: awake and alert   Airway & Oxygen Therapy: Patient Spontanous Breathing and Patient connected to face mask oxygen  Post-op Assessment: Report given to PACU RN and Post -op Vital signs reviewed and stable  Post vital signs: Reviewed and stable  Complications: No apparent anesthesia complications

## 2013-01-13 NOTE — H&P (View-Only) (Signed)
Patient ID: [name], female   DOB: [birth year], 47 y.o.   MRN: [id number]  Chief Complaint  Patient presents with  . Bariatric Pre-op    HPI [name] is a 47 y.o. female.  This patient is here for her preoperative evaluation and preparation for laparoscopic vertical sleeve gastrectomy.   She weighs 270 pounds and has obesity-related comorbidities of anxiety and depression. She has completed her preoperative workup and remains interested in a vertical sleeve gastrectomy. Of note, she did have a small to moderate sliding-type hiatal hernia noted on her upper GI. HPI  Past Medical History  Diagnosis Date  . Morbid obesity   . Body mass index 45.0-49.9, adult   . Anxiety     Past Surgical History  Procedure Laterality Date  . Tubal ligation  01/2010    History reviewed. No pertinent family history.  Social History History  Substance Use Topics  . Smoking status: Former Smoker    Quit date: 10/31/2003  . Smokeless tobacco: Not on file  . Alcohol Use: No    No Known Allergies  Current Outpatient Prescriptions  Medication Sig Dispense Refill  . acetaminophen (TYLENOL) 500 MG tablet Take 500 mg by mouth every 6 (six) hours as needed for pain.      Marland Kitchen ALPRAZolam (XANAX) 0.5 MG tablet Take 0.5 mg by mouth 2 (two) times daily as needed for anxiety.       Marland Kitchen buPROPion (WELLBUTRIN XL) 150 MG 24 hr tablet Take 150 mg by mouth daily before breakfast.      . ibuprofen (ADVIL,MOTRIN) 200 MG tablet Take 600 mg by mouth every 6 (six) hours as needed for pain or headache.       No current facility-administered medications for this visit.    Review of Systems Review of Systems All other review of systems negative or noncontributory except as stated in the HPI  Blood pressure 146/82, pulse 83, temperature 97.2 F (36.2 C), temperature source Temporal, resp. rate 16, height 5\' 3"  (1.6 m), weight 270 lb 6.4 oz (122.653 kg).  Physical Exam Physical Exam Physical Exam   Nursing note and vitals reviewed. Constitutional: She is oriented to person, place, and time. She appears well-developed and well-nourished. No distress.  HENT:  Head: Normocephalic and atraumatic.  Mouth/Throat: No oropharyngeal exudate.  Eyes: Conjunctivae and EOM are normal. Pupils are equal, round, and reactive to light. Right eye exhibits no discharge. Left eye exhibits no discharge. No scleral icterus.  Neck: Normal range of motion. Neck supple. No tracheal deviation present.  Cardiovascular: Normal rate, regular rhythm, normal heart sounds and intact distal pulses.   Pulmonary/Chest: Effort normal and breath sounds normal. No stridor. No respiratory distress. She has no wheezes.  Abdominal: Soft. Bowel sounds are normal. She exhibits no distension and no mass. There is no tenderness. There is no rebound and no guarding.  Musculoskeletal: Normal range of motion. She exhibits no edema and no tenderness.  Neurological: She is alert and oriented to person, place, and time.  Skin: Skin is warm and dry. No rash noted. She is not diaphoretic. No erythema. No pallor.  Psychiatric: She has a normal mood and affect. Her behavior is normal. Judgment and thought content normal.    Data Reviewed   Assessment    Morbid obesity with obesity related comorbidities of anxiety and depression We again discussed the surgical weight loss procedures and her options. She remains interested in the vertical sleeve gastrectomy. We discussed the surgery and its  risks. The risks of infection, bleeding, pain, scarring, weight regain, too little or too much weight loss, vitamin deficiencies and need for lifelong vitamin supplementation, hair loss, need for protein supplementation, leaks, stricture, reflux, food intolerance, need for reoperation and conversion to roux Y gastric bypass, need for open surgery, injury to spleen or surrounding structures, DVT's, PE, and death again discussed with the patient and the  patient expressed understanding and desires to proceed with laparoscopic vertical sleeve gastrectomy, possible open, intraoperative endoscopy. We again discussed the possibility of worsening reflux. We also talked about the perioperative course and expectations and she again would like to proceed with vertical sleeve gastrectomy      Plan    We will proceed with surgery as already scheduled        [name] DAVID 01/01/2013, 5:39 PM

## 2013-01-13 NOTE — Brief Op Note (Signed)
01/13/2013  1:17 PM  PATIENT:  [name]  47 y.o. female  PRE-OPERATIVE DIAGNOSIS:  morbid obesity  POST-OPERATIVE DIAGNOSIS:  MORBID OBESITY   PROCEDURE:  Procedure(s) with comments: LAPAROSCOPIC GASTRIC SLEEVE RESECTION with hiatal hernia repair (N/A) - Laparoscopic Sleeve Gastrectomy with EGD ESOPHAGOGASTRODUODENOSCOPY (EGD) (N/A) LAPAROSCOPIC REPAIR OF HIATAL HERNIA  SURGEON:  Surgeon(s) and Role:    * Lodema Pilot, DO - Primary    * Mariella Saa, MD - Assisting  PHYSICIAN ASSISTANT:   ASSISTANTS: Hoxworth   ANESTHESIA:   general  EBL:  Total I/O In: 1600 [I.V.:1600] Out: 125 [Urine:125]  BLOOD ADMINISTERED:none  DRAINS: (61F) Jackson-Pratt drain(s) with closed bulb suction in the sleeve staple line   LOCAL MEDICATIONS USED:  MARCAINE    and LIDOCAINE   SPECIMEN:  Source of Specimen:  greater curve of stomach  DISPOSITION OF SPECIMEN:  PATHOLOGY  COUNTS:  YES  TOURNIQUET:  * No tourniquets in log *  DICTATION: .Other Dictation: Dictation Number dictated [id number]  PLAN OF CARE: Admit to inpatient   PATIENT DISPOSITION:  PACU - hemodynamically stable.   Delay start of Pharmacological VTE agent (>24hrs) due to surgical blood loss or risk of bleeding: no

## 2013-01-14 ENCOUNTER — Encounter (HOSPITAL_COMMUNITY): Payer: Self-pay | Admitting: General Surgery

## 2013-01-14 LAB — COMPREHENSIVE METABOLIC PANEL
ALT: 11 U/L (ref 0–35)
AST: 15 U/L (ref 0–37)
Alkaline Phosphatase: 75 U/L (ref 39–117)
CO2: 23 mEq/L (ref 19–32)
Calcium: 8.8 mg/dL (ref 8.4–10.5)
Chloride: 104 mEq/L (ref 96–112)
GFR calc non Af Amer: 88 mL/min — ABNORMAL LOW (ref >90)
Glucose, Bld: 132 mg/dL — ABNORMAL HIGH (ref 70–99)
Potassium: 4.2 mEq/L (ref 3.5–5.1)
Sodium: 134 mEq/L — ABNORMAL LOW (ref 135–145)
Total Bilirubin: 0.2 mg/dL — ABNORMAL LOW (ref 0.3–1.2)

## 2013-01-14 LAB — CBC WITH DIFFERENTIAL/PLATELET
Basophils Absolute: 0 10*3/uL (ref 0.0–0.1)
HCT: 32.8 % — ABNORMAL LOW (ref 36.0–46.0)
Hemoglobin: 11 g/dL — ABNORMAL LOW (ref 12.0–15.0)
Lymphocytes Relative: 9 % — ABNORMAL LOW (ref 12–46)
Monocytes Absolute: 1.2 10*3/uL — ABNORMAL HIGH (ref 0.1–1.0)
Monocytes Relative: 9 % (ref 3–12)
Neutro Abs: 10.8 10*3/uL — ABNORMAL HIGH (ref 1.7–7.7)
Neutrophils Relative %: 82 % — ABNORMAL HIGH (ref 43–77)
WBC: 13.1 10*3/uL — ABNORMAL HIGH (ref 4.0–10.5)

## 2013-01-14 NOTE — Progress Notes (Signed)
Patient alert, oriented, and pleasant. Denies pain except for Headache earlier. Ambulating well. Provided copy of gastric sleeve discharge instructions for review. Will follow-up with patient tomorrow.  ADJUSTABLE GASTRIC BAND DISCHARGE INSTRUCTIONS  Drs. Fredrik Rigger, Hoxworth, Broox Lonigro, and [place]  Call if you have any problems.   Call [phone] and ask for the surgeon on call.    If you need immediate assistance come to the ER at [facility]. Tell the ER personnel that you are a new post-op gastric banding patient. Signs and symptoms to report:   Severe vomiting or nausea. If you cannot tolerate clear liquids for longer than 1 day, you need to call your surgeon.    Abdominal pain which does not get better after taking your pain medication   Fever greater than 100.4 F degrees and chills   Difficulty breathing   Chest pain    Redness, swelling, drainage, or foul odor at incision sites    If your incisions open or pull apart   Swelling or pain in calf (lower leg)   Diarrhea, frequent watery, uncontrolled bowel movements.   Constipation, (no bowel movements for 3 days) if this occurs, Take Milk of Magnesia, 2 tablespoons by mouth, 3 times a day for 2 days if needed.  Call your doctor if constipation continues. Stop taking Milk of Magnesia once you have had a bowel movement. You may also use Miralax according to the label instructions.   Anything you consider "abnormal for you".   Normal side effects after Surgery:   Unable to sleep at night or concentrate   Irritability   Being tearful (crying) or depressed   These are common complaints, possibly related to your anesthesia, stress of surgery and change in lifestyle, that usually go away a few weeks after surgery.  If these feelings continue, call your medical doctor.  Wound Care You may have surgical glue, steri-strips, or staples over your incisions after surgery.  Surgical glue:  Looks like a clear film over your incisions and will  wear off gradually. Steri-strips: Strips of tape over your incisions. You may notice a yellowish color on the skin underneath the steri-strips. This is a substance used to make the steri-strips stick better. Do not pull the steri-strips off - let them fall off. Staples: Cherlynn Polo may be removed before you leave the hospital. If you go home with staples, call [facility] [phone]) for an appointment with your surgeon's nurse to have staples removed in 10 days. Showering: You may shower two days after your surgery unless otherwise instructed by your surgeon. Wash gently around wounds with warm soapy water, rinse well, and gently pat dry.  If you have a drain, you may need someone to hold this while you shower. Avoid tub baths until staples are removed and incisions are healed.    Medications   Medications should be liquid or crushed if larger than the size of a dime.  Extended release pills should not be crushed.   Depending on the size and number of medications you take, you may need to stagger/change the time you take your medications so that you do not over-fill your pouch.    Make sure you follow-up with your primary care physician to make medication adjustments needed during rapid weight loss and life-style adjustment.   If you are diabetic, follow up with the doctor that prescribes your diabetes medication(s) within one week after surgery and check your blood sugar regularly.   Do not drive while taking narcotics!  Do not take acetaminophen (Tylenol) and Roxicet or Lortab Elixir at the same time since these pain medications contain acetaminophen.  Diet at home: (First 2 Weeks)  You will see the nutritionist two weeks after your surgery. She will advance your diet if you are tolerating liquids well. Once at home, if you have severe vomiting or nausea and cannot tolerate clear liquids lasting longer than 1 day, call your surgeon.  For Same Day Surgery Discharge Patients: The day  of surgery drink water only: 2 ounces every 4 hours. If you are tolerating water, begin drinking your high protein shake the next morning. For Overnight Stay Patients: Begin high protein shake 2 ounces every 3 hours, 5 - 6 times per day.  Gradually increase the amount you drink as tolerated.  You may find it easier to slowly sip shakes throughout the day.  It is important to get your proteins in first.   Protein Shake   Drink at least 2 ounces of shake 5-6 times per day   Each serving of protein shakes should have a minimum of 15 grams of protein and no more than 5 grams of carbohydrate    Increase the amount of protein shake you drink as tolerated   Protein powder may be added to fluids such as non-fat milk or Lactaid milk (limit to 20 grams added protein powder per serving   The initial goal is to drink at least 8 ounces of protein shake/drink per day (or as directed by the nutritionist). Some examples of protein shakes are [employer], [employer], EAS Edge HP, and Unjury. Hydration   Gradually increase the amount of water and other liquids as tolerated (See Acceptable Fluids)   Gradually increase the amount of protein shake as tolerated     Sip fluids slowly and throughout the day   May use Sugar substitutes, use sparingly (limit to 6 - 8 packets per day).  Your fluid goal is 64 ounces of fluid daily. It may take a few weeks to build up to this.         32 oz (or more) should be clear liquids and 32 oz (or more) should be full liquids.         Liquids should not contain sugar, caffeine, or carbonation!  Acceptable Fluids Clear Liquids:   Water or Sugar-free flavored water, Fruit H2O   Decaffeinated coffee or tea (sugar-free)   Crystal Lite, Wyler's Lite, Minute Maid Lite   Sugar-free Jell-O   Bouillon or broth   Sugar-free Popsicle:   *Less than 20 calories each; Limit 1 per day   Full Liquids:              Protein Shakes/Drinks + 2 choices per day of other full liquids shown  below.    Other full liquids must be: No more than 12 grams of Carbs per serving,  No more than 3 grams of Fat per serving   Strained low-fat cream soup   Non-Fat milk   Fat-free Lactaid Milk   Sugar-free yogurt (Dannon Lite & Fit) Vitamins and Minerals (Start 1 day after surgery unless otherwise directed)   1 Chewable Multivitamin / Multimineral Supplement (i.e. Centrum for Adults)   Chewable Calcium Citrate with Vitamin D-3. Take 1500 mg each day.           (Example: 3 Chewable Calcium Plus 600 with Vitamin D-3 can be found at [facility])           Do not mix multivitamins  containing iron with calcium supplements; take 2 hours   apart   Do not substitute Tums (calcium carbonate) for your calcium   Menstruating women and those at risk for anemia may need extra iron. Talk with your doctor to see if you need additional iron.     If you need extra iron:  Total daily Iron recommendations (including Vitamins) = 50 - 100 mg Iron/day Do not stop taking or change any vitamins or minerals until you talk to your nutritionist or surgeon. Your nutritionist and / or physician must approve all vitamin and mineral supplements. Exercise For maximum success, begin exercising as soon as your doctor recommends. Make sure your physician approves any physical activity.   Depending on fitness level, begin with a simple walking program   Walk 5-15 minutes each day, 7 days per week.    Slowly increase until you are walking 30-45 minutes per day   Consider joining our BELT program. [phone] or email [email or web address] [email or web address] Things to remember:   You may have sexual relations when you feel comfortable. It is VERY important for female patients to use a reliable birth control method. Fertility often increases after surgery. Do not get pregnant for at least 18 months.   It is very important to keep all follow up appointments with your surgeon, nutritionist, primary care physician, and behavioral health practitioner. After the  first year, please follow up with your bariatric surgeon at least once a year in order to maintain best weight loss results.  [facility]: [phone] [facility]: [phone]   Free counseling is available for you and your family through collaboration between [facility] and [facility]. Please call [phone] and leave a message.    Consider purchasing a medical alert bracelet that says you had lap-band surgery.    [facility] has a free Bariatric Surgery Support Group that meets monthly, the 3rd Thursday, 6 pm, Classroom #1, [place]. You may register online at [email or web address], but registration is not necessary. Select Classes and Support Groups, Bariatric Surgery, or Call [phone]   Do not return to work or drive until cleared by your surgeon   Use your CPAP when sleeping if applicable   Do not lift anything greater than ten pounds for at least two weeks.   You will probably have your first fill (fluid added to your band) 6 weeks after surgery

## 2013-01-14 NOTE — Op Note (Signed)
[name], [name]            ACCOUNT NO.:  [id number]  MEDICAL RECORD NO.:  [id number]  LOCATION:  1538                         FACILITY:  [facility]  PHYSICIAN:  Lodema Pilot, MD       DATE OF BIRTH:  Aug 18, 1966  DATE OF PROCEDURE:  01/13/2013 DATE OF DISCHARGE:                              OPERATIVE REPORT   PROCEDURE:  Laparoscopic vertical sleeve gastrectomy with laparoscopic hiatal hernia repair and intraoperative endoscopy.  PREOPERATIVE DIAGNOSIS:  Hiatal hernia and obesity.  POSTOPERATIVE DIAGNOSIS:  Hiatal hernia and obesity.  SURGEON:  Lodema Pilot, MD  ASSISTANT:  Lorne Skeens. Hoxworth, MD  ANESTHESIA:  General endotracheal tube anesthesia with 30 mL of 1% lidocaine with epinephrine and 0.25% Marcaine in a 50:50 mixture.  FLUIDS:  1600 mL of crystalloid.  ESTIMATED BLOOD LOSS:  Minimal.  DRAINS:  A 19-French Blake drain placed along the sleeve staple line.  SPECIMEN:  Greater curvature of the stomach sent to Pathology for permanent section.  COMPLICATIONS:  None apparent.  FINDINGS:  The sleeve gastrectomy was created with 36-French bougie.  A 19-French Blake drain placed along the sleeve staple line and posterior closure of hiatal hernia.  INDICATION FOR PROCEDURE:  [name] is a 47 year old female __________ with arthritis and depression, who has failed medical weight loss attempts.  She is in need of durable weight loss solutions.  OPERATIVE DETAILS:  [name] was seen and evaluated in the preoperative area, and risks and benefits of procedure were discussed in lay terms.  Informed consent was obtained.  She was taken to the operating room, placed on the table in supine position.  General endotracheal anesthesia was obtained.  Prophylactic antibiotics were given and subcutaneous heparin was administered for DVT prophylaxis. General endotracheal tube anesthesia was obtained and Foley catheter was placed and her abdomen was prepped and draped in  a standard surgical fashion.  Procedure time-out was performed with all operative team members to confirm proper patient and procedure, and a 5-mm Optiview trocar was used to access the left upper quadrant and pneumoperitoneum was obtained.  Laparoscope was introduced and there was no evidence of bleeding or bowel injury upon entry and a 5-mm left rectus port was placed, 5 mm right upper quadrant port was placed and a 15-mm right rectus port was placed, all under direct visualization.  The left lobe of the liver was fairly small and anteriorly displaced, and so I did not need to place a liver retractor for visualization.  She did have an apparent hiatal hernia, but mostly posterior and measured out 5 cm in pylorus and started to divide the greater curvature short gastric vessels and into the lesser sac.  I carried this division along the greater curvature, up around the spleen and up to the diaphragm.  I mobilized the stomach all the way up to the angle of His.  She did appear to have some posterior stomach and hiatal hernia.  So, I completely mobilized this and visualized the left crus.  I took down some of the anterior gastrophrenic ligament and carried this around to the right crus.  I entered the pars flaccida and identified the right crus, mobilized the esophagus bluntly and reduced  all the stomach to the posterior hiatal hernia.  After this was all mobilized, it appeared that the hernia was really not that significant.  I did place 2 sutures posteriorly using 0 Ethibond suture through the right and left crus to approximate the posterior hiatal hernia.  Then started to proceed with the sleeve gastrectomy.  All the tubes were removed from the stomach and again measured to the pylorus 5 cm and started to create the sleeve using 60 mm black Tri-Staple firing of the Endo-GIA.  Care was taken to avoid narrowing the stomach at the angle of His.  The second black Tri- Staple load was placed  on the stomach through the crotch of the prior staple length and angled this up towards the angle of His and much caution was taken with this staple firing as to minimize narrowing of the stomach at the angle of His.  I inspected the stomach anterior and posteriorly to make sure that we were leaving plenty of stomach and after I clamped the stapler, we passed a 36-French bougie into the esophagus.  It seemed to hang up at the diaphragm closure, so I cut out the anterior suture that I placed and then the bougie passed without difficulty after this, leaving one of the crural stitches in place.  The bougie was passed along the lesser curvature and into the duodenum and passed the stapler already in place and a second firing of the black Tri- Staple load was taken.  I then transitioned to purple Tri-Staple load and carried the division of the stomach towards the angle of His, taking care to avoid rotation of the staple line in Christmas tree formation.  I did mobilize little bit of the fat pad near the angle of His to ensure I could see the stomach and esophagus junction and with the final firing of the staple angled away from the esophagus and angling laterally.  The stomach was completely transected and the bougie was removed.  The staple line was inspected for hemostasis and there was bleeding at one of the crossing of the staple line.  I placed a 2-0 Vicryl stitch over this seam on the staple load, which controlled the bleeding and oversewed this particular corner of the sleeve staple length.  The staple line was noted be hemostatic.  I Submerged the stomach with saline solution and Dr. Daphine Deutscher performed upper endoscopy passing the endoscope through the esophagus to the GE junction and into the sleeve.  There was no evidence of internal bleeds within the sleeve and there was no evidence of any stricturing of the sleeve and the scope was easily driven into the pylorus and duodenum. The  scope was withdrawn and there was no evidence of any air leakage as well.  The sleeve was submerged into water.  The scope was withdrawn and the air was suctioned and the sleeve was again noted to be hemostatic. I suctioned remainder of the fluid from the abdomen and we coated the sleeve staple line with Tisseel fibrin glue and placed a 19-French Blake just posterior to the sleeve staple line.  This was exited through the left upper quadrant trocar site and sutured in place with a 2-0 nylon drain stitch and the omentum was placed up over the sleeve and the drains.  The resected stomach was removed through the 15-mm port site after enlarging the fascial opening and the specimen was sent to Pathology for permanent section.  I then approximated the fascial defect with interrupted 3-0  Vicryl sutures in open fashion.  Sutures were secured and I re-insufflated the abdomen with carbon dioxide gas and inspected the closure for hemostasis and to make sure there was no evidence of bowel injury upon closure.  The remainder of the abdomen appeared hemostatic.  There was no evidence of bleeding or bowel injury.  The drain appeared to be in good position and the fascial defect was well approximated.  Final trocars removed and the wounds were injected with total of 30 mL of 1% lidocaine with epinephrine and 0.25% Marcaine in 50/50 mixture.  The wound was irrigated with sterile saline solution and the skin edges were approximated with 4-0 Monocryl subcuticular suture.  Skin was washed and dried and Dermabond was applied.  All sponge, needle, and instrument counts correct at end of the case.  The patient tolerated the procedure well without apparent complications.          ______________________________ Lodema Pilot, MD     BL/MEDQ  D:  01/13/2013  T:  01/14/2013  Job:  [id number]

## 2013-01-14 NOTE — Care Management Note (Signed)
    Page 1 of 1   01/14/2013     11:50:22 AM   CARE MANAGEMENT NOTE 01/14/2013  Patient:  [name], [name]   Account Number:  [id number]  Date Initiated:  01/14/2013  Documentation initiated by:  Lorenda Ishihara  Subjective/Objective Assessment:   47 yo female admitted s/p lap sleeve gastrectecomy and HH repair. PTA lived at home with spouse.     Action/Plan:   Home when stable   Anticipated DC Date:  01/16/2013   Anticipated DC Plan:  HOME/SELF CARE      DC Planning Services  CM consult      Choice offered to / List presented to:             Status of service:  Completed, signed off Medicare Important Message given?   (If response is "NO", the following Medicare IM given date fields will be blank) Date Medicare IM given:   Date Additional Medicare IM given:    Discharge Disposition:  HOME/SELF CARE  Per UR Regulation:  Reviewed for med. necessity/level of care/duration of stay  If discussed at Long Length of Stay Meetings, dates discussed:    Comments:

## 2013-01-14 NOTE — Progress Notes (Signed)
1 Day Post-Op  Subjective: Her only complaint is headache.  No abdominal pain or nausea  Objective: Vital signs in last 24 hours: Temp:  [98.2 F (36.8 C)-99.8 F (37.7 C)] 98.3 F (36.8 C) (04/08 0518) Pulse Rate:  [59-92] 59 (04/08 0518) Resp:  [16-27] 16 (04/08 0518) BP: (99-148)/(50-83) 99/50 mmHg (04/08 0518) SpO2:  [93 %-100 %] 96 % (04/08 0518) Weight:  [263 lb 2 oz (119.353 kg)] 263 lb 2 oz (119.353 kg) (04/07 1415) Last BM Date: 01/12/13  Intake/Output from previous day: 04/07 0701 - 04/08 0700 In: 3550 [I.V.:3550] Out: 1695 [Urine:1625; Drains:70] Intake/Output this shift:    General appearance: alert, cooperative and no distress Resp: nonlabored Cardio: normal rate, regular GI: soft, minimal incisional tenderness, ND, wounds without infection, JP ss, no peritoneal signs Extremities: SCD's bilat  Lab Results:   Recent Labs  01/14/13 0517  WBC 13.1*  HGB 11.0*  HCT 32.8*  PLT 296   BMET  Recent Labs  01/14/13 0517  NA 134*  K 4.2  CL 104  CO2 23  GLUCOSE 132*  BUN 8  CREATININE 0.80  CALCIUM 8.8   PT/INR No results found for this basename: LABPROT, INR,  in the last 72 hours ABG No results found for this basename: PHART, PCO2, PO2, HCO3,  in the last 72 hours  Studies/Results: No results found.  Anti-infectives: Anti-infectives   Start     Dose/Rate Route Frequency Ordered Stop   01/13/13 0855  ertapenem (INVANZ) 1 g in sodium chloride 0.9 % 50 mL IVPB     1 g 100 mL/hr over 30 Minutes Intravenous On call to O.R. 01/13/13 0855 01/13/13 1030      Assessment/Plan: s/p Procedure(s) with comments: LAPAROSCOPIC GASTRIC SLEEVE RESECTION with hiatal hernia repair (N/A) - Laparoscopic Sleeve Gastrectomy with EGD ESOPHAGOGASTRODUODENOSCOPY (EGD) (N/A) LAPAROSCOPIC REPAIR OF HIATAL HERNIA she looks and feels great.  no evidence of postop complication.  we will trial advancing diet and continue to mobilize.  HGB down a little but I think that  it should be okay to start lovenox.  will repeat.  LOS: 1 day    Lodema Pilot DAVID 01/14/2013

## 2013-01-15 MED ORDER — OXYCODONE-ACETAMINOPHEN 5-325 MG/5ML PO SOLN: 5.0000 mL | ORAL | Status: DC | PRN

## 2013-01-15 MED ORDER — ONDANSETRON 4 MG PO TBDP: 4.0000 mg | ORAL_TABLET | Freq: Three times a day (TID) | ORAL | Status: DC | PRN

## 2013-01-15 NOTE — Progress Notes (Signed)
2 Days Post-Op  Subjective: Feels okay.  "gassy" but no nausea and tolerating liquids. Minimal pain  Objective: Vital signs in last 24 hours: Temp:  [98.1 F (36.7 C)-99.4 F (37.4 C)] 98.2 F (36.8 C) (04/09 0558) Pulse Rate:  [53-67] 67 (04/09 0558) Resp:  [18] 18 (04/09 0558) BP: (103-119)/(58-76) 112/69 mmHg (04/09 0558) SpO2:  [96 %-98 %] 97 % (04/09 0558) Last BM Date: 01/11/13  Intake/Output from previous day: 04/08 0701 - 04/09 0700 In: 2939.6 [I.V.:2939.6] Out: 3975 [Urine:3900; Drains:75] Intake/Output this shift:    General appearance: alert, cooperative and no distress Resp: nonlabored Cardio: normal rate, reguar GI: soft, minimal tenderness, ND, wounds okay. jp appropriate Extremities: SCD's bilat  Lab Results:   Recent Labs  01/14/13 0517 01/14/13 0806  WBC 13.1*  --   HGB 11.0* 12.0  HCT 32.8* 36.9  PLT 296  --    BMET  Recent Labs  01/14/13 0517  NA 134*  K 4.2  CL 104  CO2 23  GLUCOSE 132*  BUN 8  CREATININE 0.80  CALCIUM 8.8   PT/INR No results found for this basename: LABPROT, INR,  in the last 72 hours ABG No results found for this basename: PHART, PCO2, PO2, HCO3,  in the last 72 hours  Studies/Results: No results found.  Anti-infectives: Anti-infectives   Start     Dose/Rate Route Frequency Ordered Stop   01/13/13 0855  ertapenem (INVANZ) 1 g in sodium chloride 0.9 % 50 mL IVPB     1 g 100 mL/hr over 30 Minutes Intravenous On call to O.R. 01/13/13 0855 01/13/13 1030      Assessment/Plan: s/p Procedure(s) with comments: LAPAROSCOPIC GASTRIC SLEEVE RESECTION with hiatal hernia repair (N/A) - Laparoscopic Sleeve Gastrectomy with EGD ESOPHAGOGASTRODUODENOSCOPY (EGD) (N/A) LAPAROSCOPIC REPAIR OF HIATAL HERNIA she looks and feels well. tolerating liquids.  she should be okay for discharge today  LOS: 2 days    Lodema Pilot DAVID 01/15/2013

## 2013-01-15 NOTE — Progress Notes (Signed)
Patient alert and oriented. Minimal pain. Tolerating water. Plan is to advance to protein shake today. Reviewed gastric sleeve discharge instructions with patient, patient able to articulate understanding.   GASTRIC BYPASS / SLEEVE  Home Care Instructions  These instructions are to help you care for yourself when you go home.  Call: If you have any problems.   Call [phone] and ask for the surgeon on call   If you need immediate assistance come to the ER at [facility]. Tell the ER staff that you are a new post-op gastric bypass or gastric sleeve patient   Signs and symptoms to report:   Severe vomiting or nausea o If you cannot handle clear liquids for longer than 1 day, call your surgeon    Abdominal pain which does not get better after taking your pain medication   Fever greater than 100.4 F and chills   Heart rate over 100 beats a minute   Trouble breathing   Chest pain    Redness, swelling, drainage, or foul odor at incision (surgical) sites    If your incisions open or pull apart   Swelling or pain in calf (lower leg)   Diarrhea (Loose bowel movements that happen often), frequent watery, uncontrolled bowel movements   Constipation, (no bowel movements for 3 days) if this happens:  o Take Milk of Magnesia, 2 tablespoons by mouth, 3 times a day for 2 days if needed o Stop taking Milk of Magnesia once you have had a bowel movement o Call your doctor if constipation continues Or o Take Miralax  (instead of Milk of Magnesia) following the label instructions o Stop taking Miralax once you have had a bowel movement o Call your doctor if constipation continues   Anything you think is "abnormal for you"   Normal side effects after surgery:   Unable to sleep at night or unable to concentrate   Irritability   Being tearful (crying) or depressed These are common complaints, possibly related to your anesthesia, stress of surgery and change in lifestyle, that usually go away a few  weeks after surgery.  If these feelings continue, call your medical doctor.  Wound Care: You may have surgical glue, steri-strips, or staples over your incisions after surgery   Surgical glue:  Looks like a clear film over your incisions and will wear off a little at a time   Steri-strips : Adhesive strips of tape over your incisions. You may notice a yellowish color on the skin under the steri-strips. This is used to make the   steri-strips stick better. Do not pull the steri-strips off - let them fall off   Staples: Staples may be removed before you leave the hospital o If you go home with staples, call [facility] at for an appointment with your surgeon's nurse to have staples removed 10 days after surgery, [phone]   Showering: You may shower two (2) days after your surgery unless your surgeon tells you differently o Wash gently around incisions with warm soapy water, rinse well, and gently pat dry  o If you have a drain (tube from your incision), you may need someone to hold this while you shower  o No tub baths until staples are removed and incisions are healed     Medications:   Medications should be liquid or crushed if larger than the size of a dime   Extended release pills (medication that releases a little bit at a time through the day) should  not be crushed   Depending on the size and number of medications you take, you may need to space (take a few throughout the day)/change the time you take your medications so that you do not over-fill your pouch (smaller stomach)   Make sure you follow-up with your primary care physician to make medication changes needed during rapid weight loss and life-style changes   If you have diabetes, follow up with the doctor that orders your diabetes medication(s) within one week after surgery and check your blood sugar regularly.   Do not drive while taking narcotics (pain medications)   Do not take acetaminophen (Tylenol) and Roxicet or  Lortab Elixir at the same time since these pain medications contain acetaminophen  Diet:                    First 2 Weeks  You will see the nutritionist about two (2) weeks after your surgery. The nutritionist will increase the types of foods you can eat if you are handling liquids well:   If you have severe vomiting or nausea and cannot handle clear liquids lasting longer than 1 day, call your surgeon  Protein Shake   Drink at least 2 ounces of shake 5-6 times per day   Each serving of protein shakes (usually 8 - 12 ounces) should have a minimum of:  o 15 grams of protein  o And no more than 5 grams of carbohydrate    Goal for protein each day: o Men = 80 grams per day o Women = 60 grams per day   Protein powder may be added to fluids such as non-fat milk or Lactaid milk or Soy milk (limit to 35 grams added protein powder per serving)  Hydration   Slowly increase the amount of water and other clear liquids as tolerated (See Acceptable Fluids)   Slowly increase the amount of protein shake as tolerated     Sip fluids slowly and throughout the day   May use sugar substitutes in small amounts (no more than 6 - 8 packets per day; i.e. Splenda)  Fluid Goal   The first goal is to drink at least 8 ounces of protein shake/drink per day (or as directed by the nutritionist); some examples of protein shakes are [employer], [employer]illard[employer], EAS Edge HP, and Unjury. See handout from pre-op Bariatric Education Class: o Slowly increase the amount of protein shake you drink as tolerated o You may find it easier to slowly sip shakes throughout the day o It is important to get your proteins in first   Your fluid goal is to drink 64 - 100 ounces of fluid daily o It may take a few weeks to build up to this   32 oz (or more) should be clear liquids  And    32 oz (or more) should be full liquids (see below for examples)   Liquids should not contain sugar, caffeine, or carbonation  Clear Liquids:    Water or Sugar-free flavored water (i.e. Fruit H2O, Propel)   Decaffeinated coffee or tea (sugar-free)   Crystal Lite, Wyler's Lite, Minute Maid Lite   Sugar-free Jell-O   Bouillon or broth   Sugar-free Popsicle:   *Less than 20 calories each; Limit 1 per day  Full Liquids: Protein Shakes/Drinks + 2 choices per day of other full liquids   Full liquids must be: o No More Than 12 grams of Carbs per serving  o No More Than 3 grams  of Fat per serving   Strained low-fat cream soup   Non-Fat milk   Fat-free Lactaid Milk   Sugar-free yogurt (Dannon Lite & Fit, Greek yogurt)      Vitamins and Minerals   Start 1 day after surgery unless otherwise directed by your surgeon   2 Chewable Multivitamin / Multimineral Supplement with iron (i.e. Centrum for Adults)   Vitamin B-12, 350 - 500 micrograms sub-lingual (place tablet under the tongue) each day   Chewable Calcium Citrate with Vitamin D-3 (Example: 3 Chewable Calcium Plus 600 with Vitamin D-3) o Take 500 mg three (3) times a day for a total of 1500 mg each day o Do not take all 3 doses of calcium at one time as it may cause constipation, and you can only absorb 500 mg  at a time  o Do not mix multivitamins containing iron with calcium supplements; take 2 hours apart o Do not substitute Tums (calcium carbonate) for your calcium   Menstruating women and those at risk for anemia (a blood disease that causes weakness) may need extra iron o Talk with your doctor to see if you need more iron   If you need extra iron: Total daily Iron recommendation (including Vitamins) is 50 to 100 mg Iron/day   Do not stop taking or change any vitamins or minerals until you talk to your nutritionist or surgeon   Your nutritionist and/or surgeon must approve all vitamin and mineral supplements   Activity and Exercise: It is important to continue walking at home.  Limit your physical activity as instructed by your doctor.  During this time, use these  guidelines:   Do not lift anything greater than ten (10) pounds for at least two (2) weeks   Do not go back to work or drive until Designer, industrial/product says you can   You may have sex when you feel comfortable  o It is VERY important for female patients to use a reliable birth control method; fertility often increases after surgery  o Do not get pregnant for at least 18 months   Start exercising as soon as your doctor tells you that you can o Make sure your doctor approves any physical activity   Start with a simple walking program   Walk 5-15 minutes each day, 7 days per week.    Slowly increase until you are walking 30-45 minutes per day Consider joining our BELT program. [phone] or email [email or web address]   Special Instructions Things to remember:   Free counseling is available for you and your family through collaboration between [facility] and [facility]. Please call [phone] and leave a message   Use your CPAP when sleeping if this applies to you    Consider buying a medical alert bracelet that says you had lap-band surgery    You will likely have your first fill (fluid added to your band) 6 - 8 weeks after surgery   [facility] has a free Bariatric Surgery Support Group that meets monthly, the 3rd Thursday, 6 pm, [facility] Classrooms You can see classes online at [email or web address]   It is very important to keep all follow up appointments with your surgeon, nutritionist, primary care physician, and behavioral health practitioner o After the first year, please follow up with your bariatric surgeon and nutritionist at least once a year in order to maintain best weight loss results [facility]: [phone] [facility]: [phone] Bariatric Nurse Coordinator: [phone]

## 2013-01-16 NOTE — Discharge Summary (Signed)
Physician Discharge Summary  Patient ID: [name] MRN: [id number] DOB/AGE: 1966/05/15 47 y.o.  Admit date: 01/13/2013 Discharge date: 01/16/2013  Admission Diagnoses: obesity  Discharge Diagnoses: same Active Problems:   * No active hospital problems. *   Discharged Condition: stable  Hospital Course: to OR 01/13/13 for lap sleeve gastrectomy.  Tolerated well.  Diet advanced on pod 1 which she tolerated well.  She had minimal pain and no nausea and stable for discharge to home on POD 2.   Consults: None  Significant Diagnostic Studies: none  Treatments: surgery: sleeve gastrectomy 01/13/13   Disposition: 01-Home or Self Care  Discharge Orders   Future Appointments Provider Department Dept Phone   01/28/2013 4:00 PM [facility] Post-Op Class [facility] [phone]   02/06/2013 2:30 PM Lodema Pilot, DO [facility], [place] [phone]   Future Orders Complete By Expires     Call MD for:  difficulty breathing, headache or visual disturbances  As directed     Call MD for:  persistant dizziness or light-headedness  As directed     Call MD for:  persistant nausea and vomiting  As directed     Call MD for:  redness, tenderness, or signs of infection (pain, swelling, redness, odor or green/yellow discharge around incision site)  As directed     Call MD for:  severe uncontrolled pain  As directed     Call MD for:  temperature >100.4  As directed     Diet - low sodium heart healthy  As directed     Discharge instructions  As directed     Comments:      May shower tomorrow. Follow up with Dr. Biagio Quint in 3 weeks Increase activity as tolerated.   Start vitamins and protein shakes. Full liquid diet x1 week, then pureed diet x1 week, then soft diet x1 week, then gradually increase to high protein, low fat, low carb diet    Increase activity slowly  As directed         Medication List    TAKE these medications       acetaminophen  500 MG tablet  Commonly known as:  TYLENOL  Take 500 mg by mouth every 6 (six) hours as needed for pain.     ALPRAZolam 0.5 MG tablet  Commonly known as:  XANAX  Take 0.5 mg by mouth 2 (two) times daily as needed for anxiety.     buPROPion 150 MG 24 hr tablet  Commonly known as:  WELLBUTRIN XL  Take 150 mg by mouth daily before breakfast.     ibuprofen 200 MG tablet  Commonly known as:  ADVIL,MOTRIN  Take 600 mg by mouth every 6 (six) hours as needed for pain or headache.     ondansetron 4 MG disintegrating tablet  Commonly known as:  ZOFRAN ODT  Take 1 tablet (4 mg total) by mouth every 8 (eight) hours as needed for nausea.     oxyCODONE-acetaminophen 5-325 MG/5ML solution  Commonly known as:  ROXICET  Take 5-10 mLs by mouth every 4 (four) hours as needed.         SignedLodema Pilot DAVID 01/16/2013, 10:44 AM

## 2013-01-28 ENCOUNTER — Encounter: Payer: 59 | Attending: General Surgery | Admitting: *Deleted

## 2013-01-28 DIAGNOSIS — Z713 Dietary counseling and surveillance: Secondary | ICD-10-CM | POA: Insufficient documentation

## 2013-01-28 DIAGNOSIS — Z01818 Encounter for other preprocedural examination: Secondary | ICD-10-CM | POA: Insufficient documentation

## 2013-01-28 DIAGNOSIS — Z9884 Bariatric surgery status: Secondary | ICD-10-CM | POA: Insufficient documentation

## 2013-02-06 ENCOUNTER — Encounter (INDEPENDENT_AMBULATORY_CARE_PROVIDER_SITE_OTHER): Payer: Self-pay

## 2013-02-06 ENCOUNTER — Encounter (INDEPENDENT_AMBULATORY_CARE_PROVIDER_SITE_OTHER): Payer: Self-pay | Admitting: General Surgery

## 2013-02-06 ENCOUNTER — Ambulatory Visit (INDEPENDENT_AMBULATORY_CARE_PROVIDER_SITE_OTHER): Payer: 59 | Admitting: General Surgery

## 2013-02-06 VITALS — BP 100/82 | HR 91 | Temp 97.7°F | Ht 63.0 in | Wt 243.6 lb

## 2013-02-06 DIAGNOSIS — Z5189 Encounter for other specified aftercare: Secondary | ICD-10-CM

## 2013-02-06 DIAGNOSIS — Z4889 Encounter for other specified surgical aftercare: Secondary | ICD-10-CM

## 2013-02-06 NOTE — Progress Notes (Signed)
Subjective:     Patient ID: [name], female   DOB: November 14, 1965, 47 y.o.   MRN: [id number]  HPI F/u 3 weeks s/p sleeve gastrectomy.  She has lost 26 lbs since her surgery.  She is doing very well and has not taken any pain medication since she left the hospital. She says that she feels good and has no food intolerances. She denies any hunger. She has had sufficient some chicken and has not had any issues with that. She did vomit one time after eating but has  Not had any other issues with vomiting. She has had some mild reflux at night when lying flat if she eats or drinks too late at night similar to before her surgery but has not had any worsening reflux. She is taking her vitamins and perking supplements and getting about 50 g per day. She is walking   Review of Systems     Objective:   Physical Exam No distress and nontoxic-appearing . Her abdomen is soft and nontender on exam. Her incisions are healing well without sign of infection there is no numbness or tingling or evidence of neurologic deficit    Assessment:     Status post vertical sleeve gastrectomy-doing well She is doing very well from her procedure and has no evidence of any postoperative complication. She has had good weight loss and has no food intolerances. She seems to be taking her vitamins and protein and is staying active. I have really no new additions to her care and she seems to be doing remarkably well.     Plan:     She will continue with current bariatric diet and activity and I will see her back in about 2 months and we will check nutrition labs at that time.

## 2013-03-01 NOTE — Progress Notes (Signed)
Bariatric Class:  Appt start time: 1600   End time:  1700.  2 Week Post-Operative Nutrition Class  Patient was seen on 01/28/13 for Post-Operative Nutrition education [facility].   Surgery date: 01/13/13  Surgery type: Gastric Sleeve  Start weight at [facility]: 270.6 lbs (11/06/12)   Weight today: 248.5 lbs  Weight change: 21.0 lbs  Total weight lost: 22.1 lbs   TANITA BODY COMP RESULTS   12/26/12  01/28/13  BMI (kg/m^2)  47.7  44.0  Fat Mass (lbs)  146.5  128.0  Fat Free Mass (lbs)  123.0  120.5  Total Body Water (lbs)  90.0  88.0   The following the learning objectives were met by the patient during this course:   Identifies Phase 3A (Soft, High Proteins) Dietary Goals and will begin from 2 weeks post-operatively to 2 months post-operatively  Identifies appropriate sources of fluids and proteins   States protein recommendations and appropriate sources post-operatively  Identifies the need for appropriate texture modifications, mastication, and bite sizes when consuming solids  Identifies appropriate multivitamin and calcium sources post-operatively  Describes the need for physical activity post-operatively and will follow MD recommendations  States when to call healthcare provider regarding medication questions or post-operative complications  Handouts given during class include:  Phase 3A: Soft, High Protein Diet Handout  Follow-Up Plan: Patient will follow-up at [facility] in 6 weeks for 2 months post-op nutrition visit for diet advancement per MD.

## 2013-03-01 NOTE — Patient Instructions (Signed)
Patient to follow Phase 3A-Soft, High Protein Diet and follow-up at [facility] in 6 weeks for 2 months post-op nutrition visit for diet advancement. 

## 2013-03-11 ENCOUNTER — Encounter: Payer: Self-pay | Admitting: *Deleted

## 2013-03-11 ENCOUNTER — Encounter: Payer: 59 | Attending: General Surgery | Admitting: *Deleted

## 2013-03-11 DIAGNOSIS — Z9884 Bariatric surgery status: Secondary | ICD-10-CM | POA: Insufficient documentation

## 2013-03-11 DIAGNOSIS — Z713 Dietary counseling and surveillance: Secondary | ICD-10-CM | POA: Insufficient documentation

## 2013-03-11 DIAGNOSIS — Z01818 Encounter for other preprocedural examination: Secondary | ICD-10-CM | POA: Insufficient documentation

## 2013-03-11 NOTE — Patient Instructions (Addendum)
Goals:  Follow Phase 3B: High Protein + Non-Starchy Vegetables  Increase lean protein foods to meet 60-80g goal  Increase fluid intake to 64oz +  Avoid drinking 15 minutes before, during and 30 minutes after eating  Aim for >30 min of physical activity daily  TANITA BODY COMP RESULTS   12/26/12  01/28/13 03/11/13  BMI (kg/m^2)  47.7  44.0 41.0  Fat Mass (lbs)  146.5  128.0 112.5  Fat Free Mass (lbs)  123.0  120.5 119.0  Total Body Water (lbs)  90.0  88.0 87.0

## 2013-03-11 NOTE — Progress Notes (Signed)
  Follow-up visit:  8 Weeks Post-Operative Sleeve Gastrectomy Surgery  Medical Nutrition Therapy:  Appt start time: 0830 end time:  0900.  Primary concerns today: Post-operative Bariatric Surgery Nutrition Management.  Surgery date: 01/13/13  Surgery type: Gastric Sleeve  Start weight at [facility]: 270.6 lbs (11/06/12)   Weight today: 231.5 lbs  Weight change: 17.0 lbs  Total weight lost: 39.1 lbs   Goal weight: 130-140 lbs  TANITA BODY COMP RESULTS   12/26/12  01/28/13 03/11/13  BMI (kg/m^2)  47.7  44.0 41.0  Fat Mass (lbs)  146.5  128.0 112.5  Fat Free Mass (lbs)  123.0  120.5 119.0  Total Body Water (lbs)  90.0  88.0 87.0   24-hr recall: B (AM): Chicken (3 oz) - 20-25 g Snk (AM): NONE  L (PM): Rev or PB3 without wrap (13-19 g) Snk (PM): [place] yogurt or NONE  D (PM): 12-14 med shrimp (3 oz) -  Snk (PM): NONE or Greek Yogurt Popsicle  Fluid intake:  65-80 oz (water); 6 oz coffee (regular) Estimated total protein intake: 60-70 g  Medications: No changes Supplementation:  Taking  Using straws: Yes; no gas reported Drinking while eating: Yes; baby sips - having a hard time with it. Working on it.  Hair loss: No Carbonated beverages: No N/V/D/C: Mild constipation at times; not bothersome. One episode of terrible diarrhea with roast beef cooked with a seasoning packet.  Dumping syndrome: No  Recent physical activity:  3-4 days/week @ gym over lunch - 35-40 min; Using a Fit Bit  Progress Towards Goal(s):  In progress.  Handouts given during visit include:  Phase 3B: High Protein + Non-Starchy Vegetables    Nutritional Diagnosis:  Brinkley-3.3 Overweight/obesity related to past poor dietary habits and physical inactivity as evidenced by patient w/ recent gastric sleeve surgery following dietary guidelines for continued weight loss.    Intervention:  Nutrition education/diet advancement.  Monitoring/Evaluation:  Dietary intake, exercise, and body weight. Follow up in 1 months for 3  month post-op visit.

## 2013-04-10 ENCOUNTER — Ambulatory Visit: Payer: 59 | Admitting: *Deleted

## 2013-04-17 ENCOUNTER — Encounter (INDEPENDENT_AMBULATORY_CARE_PROVIDER_SITE_OTHER): Payer: Self-pay | Admitting: General Surgery

## 2013-04-17 ENCOUNTER — Ambulatory Visit (INDEPENDENT_AMBULATORY_CARE_PROVIDER_SITE_OTHER): Payer: 59 | Admitting: General Surgery

## 2013-04-17 VITALS — BP 120/80 | HR 85 | Temp 97.4°F | Resp 16 | Ht 63.0 in | Wt 219.0 lb

## 2013-04-17 DIAGNOSIS — K912 Postsurgical malabsorption, not elsewhere classified: Secondary | ICD-10-CM

## 2013-04-17 DIAGNOSIS — Z09 Encounter for follow-up examination after completed treatment for conditions other than malignant neoplasm: Secondary | ICD-10-CM

## 2013-04-17 DIAGNOSIS — Z9884 Bariatric surgery status: Secondary | ICD-10-CM

## 2013-04-17 DIAGNOSIS — Z6838 Body mass index (BMI) 38.0-38.9, adult: Secondary | ICD-10-CM

## 2013-04-17 NOTE — Progress Notes (Signed)
Subjective:     Patient ID: [name], female   DOB: 1966-02-04, 47 y.o.   MRN: [id number]  HPI 3 month follow up after sleeve gastrectomy.  She has lost about 51 lbs.  She feels well and has no complaints.  She denies any reflux symptoms. She denies any vomiting her abdominal pain and she says that her bowels are functioning normally. She denies any hunger and says that she is taking her protein supplements and vitamin supplements. She is exercising about 3 times per week walking about 2 and half miles per day and says that she is following the postoperative diet fairly closely.  Review of Systems     Objective:   Physical Exam She is in no acute distress and nontoxic-appearing Abdomen soft and nontender exam incisions are healing nicely    Assessment:     Status post vertical sleeve gastrectomy-doing well She is doing very well from her procedure. She has had good weight loss and has no evidence of any postoperative complications. She is exercising and taking her protein and vitamins.  I recommended that she continue to increase her physical activity and follow the postoperative diet but otherwise I have nothing else to add for her since she seems to be doing very well. I would like to check some nutrition labs today     Plan:     Continue with postoperative diet and increase activity and we will checks nutrition labs and I will see her back in about 3 months

## 2013-06-02 ENCOUNTER — Encounter: Payer: Self-pay | Admitting: *Deleted

## 2013-06-02 ENCOUNTER — Encounter: Payer: 59 | Attending: General Surgery | Admitting: *Deleted

## 2013-06-02 DIAGNOSIS — Z01818 Encounter for other preprocedural examination: Secondary | ICD-10-CM | POA: Insufficient documentation

## 2013-06-02 DIAGNOSIS — Z713 Dietary counseling and surveillance: Secondary | ICD-10-CM | POA: Insufficient documentation

## 2013-06-02 DIAGNOSIS — Z9884 Bariatric surgery status: Secondary | ICD-10-CM | POA: Insufficient documentation

## 2013-06-02 NOTE — Patient Instructions (Addendum)
Goals:  Follow Phase 3B: High Protein + Non-Starchy Vegetables  Continue intake of lean protein foods to meet 60-80g goal  Can try Laughing Cow Cheese (light)  Increase fluid intake to 64oz +  Avoid drinking 15 minutes before, during and 30 minutes after eating  Aim for >30 min of physical activity daily  May add 15 g of carbs per meal (fruit, whole grains)   Have protein with all carbs (ex: fruit w/ peanut butter, etc.)  [facility]ANITA BODY COMP RESULTS   12/26/12  01/28/13 03/11/13 06/02/13    BMI (kg/m^2)  47.7  44.0 41.0 36.7  Fat Mass (lbs)  146.5  128.0 112.5 89.0  Fat Free Mass (lbs)  123.0  120.5 119.0 118.0  Total Body Water (lbs)  90.0  88.0 87.0 86.5

## 2013-06-02 NOTE — Progress Notes (Signed)
  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  Primary concerns today: Post-operative Bariatric Surgery Nutrition Management.  Surgery date: 01/13/13  Surgery type: Gastric Sleeve  [facility] weight at [facility]: 270.6 lbs (11/06/12)   Weight today: 207.0 lbs  Weight change: 24.5 lbs  Total weight lost: 63.6 lbs   Goal weight: 130-140 lbs % goal met: 47%  TANITA BODY COMP RESULTS   12/26/12  01/28/13 03/11/13 06/02/13  BMI (kg/m^2)  47.7  44.0 41.0 36.7  Fat Mass (lbs)  146.5  128.0 112.5 89.0  Fat Free Mass (lbs)  123.0  120.5 119.0 118.0  Total Body Water (lbs)  90.0  88.0 87.0 86.5   24-hr recall:  Reports she can eat a small amount of anything. 3 oz lean protein at meals, snacks of greek yogurt/cheese sticks   Fluid intake:  65-80 oz (water); 6-12 oz coffee (regular); sometimes a protein shake (0-1 x/wk) Estimated total protein intake: 60-70 g  Medications: Not taking Wellbutrin Supplementation:  Taking  Using straws: Yes; no gas reported Drinking while eating: Yes; baby sips - having a hard time with it. Working on it.  Hair loss: No Carbonated beverages: No N/V/D/C: Mild constipation at times; not bothersome.  Dumping syndrome: No  Recent physical activity:  3-4 days/week @ gym over lunch = 35-40 min; Using a Fit Bit  Progress Towards Goal(s):  In progress.    Nutritional Diagnosis:  [place]-3.3 Overweight/obesity related to past poor dietary habits and physical inactivity as evidenced by patient w/ recent gastric sleeve surgery following dietary guidelines for continued weight loss.    Intervention:  Nutrition education/diet advancement.  Monitoring/Evaluation:  Dietary intake, exercise, and body weight. Follow up in 3 months for 7-8 month post-op visit.

## 2013-06-03 LAB — CBC WITH DIFFERENTIAL/PLATELET
Basophils Relative: 1 % (ref 0–1)
Eosinophils Absolute: 0.2 10*3/uL (ref 0.0–0.7)
Hemoglobin: 12.8 g/dL (ref 12.0–15.0)
MCH: 29.7 pg (ref 26.0–34.0)
MCHC: 32.2 g/dL (ref 30.0–36.0)
Monocytes Absolute: 0.5 10*3/uL (ref 0.1–1.0)
Monocytes Relative: 8 % (ref 3–12)
Neutrophils Relative %: 62 % (ref 43–77)
RDW: 14.2 % (ref 11.5–15.5)

## 2013-06-03 LAB — COMPREHENSIVE METABOLIC PANEL
Alkaline Phosphatase: 70 U/L (ref 39–117)
BUN: 14 mg/dL (ref 6–23)
Glucose, Bld: 77 mg/dL (ref 70–99)
Sodium: 139 mEq/L (ref 135–145)
Total Bilirubin: 0.5 mg/dL (ref 0.3–1.2)

## 2013-06-03 LAB — IRON AND TIBC
%SAT: 21 % (ref 20–55)
Iron: 77 ug/dL (ref 42–145)

## 2013-06-03 LAB — VITAMIN B12: Vitamin B-12: 657 pg/mL (ref 211–911)

## 2013-06-05 LAB — VITAMIN B1: Vitamin B1 (Thiamine): 12 nmol/L (ref 8–30)

## 2013-06-06 LAB — VITAMIN D 1,25 DIHYDROXY: Vitamin D3 1, 25 (OH)2: 51 pg/mL

## 2013-08-01 ENCOUNTER — Other Ambulatory Visit: Payer: Self-pay

## 2013-08-01 DIAGNOSIS — Z1231 Encounter for screening mammogram for malignant neoplasm of breast: Secondary | ICD-10-CM

## 2013-08-21 ENCOUNTER — Ambulatory Visit
Admission: RE | Admit: 2013-08-21 | Discharge: 2013-08-21 | Disposition: A | Discharge status: Home / Self Care | Payer: 59 | Source: Ambulatory Visit

## 2013-08-21 DIAGNOSIS — Z1231 Encounter for screening mammogram for malignant neoplasm of breast: Secondary | ICD-10-CM

## 2013-09-02 ENCOUNTER — Ambulatory Visit: Payer: 59 | Admitting: *Deleted

## 2013-10-17 IMAGING — CR DG UGI W/ KUB
15 of 20 series · 15 of 20 positions shown · non-contrast
Comparison: None.

CLINICAL DATA: Bariatric screening evaluation.  Small hiatal
hernia.

UPPER GI SERIES WITH KUB
TECHNIQUE: Routine upper GI series was performed with thin barium
Fluoroscopy Time: 1.9 minutes

[run (1 of 10)]
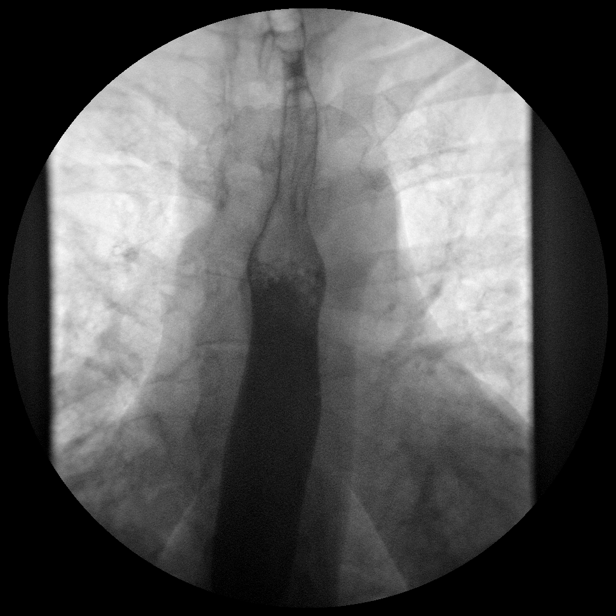

[run (2 of 10)]
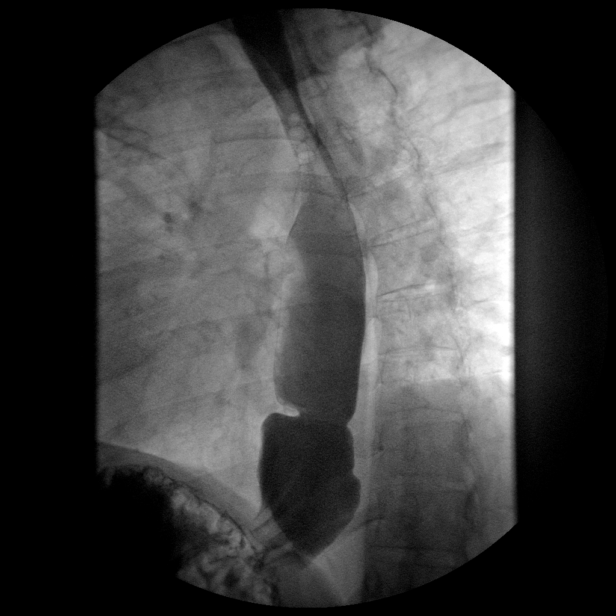

[run (3 of 10)]
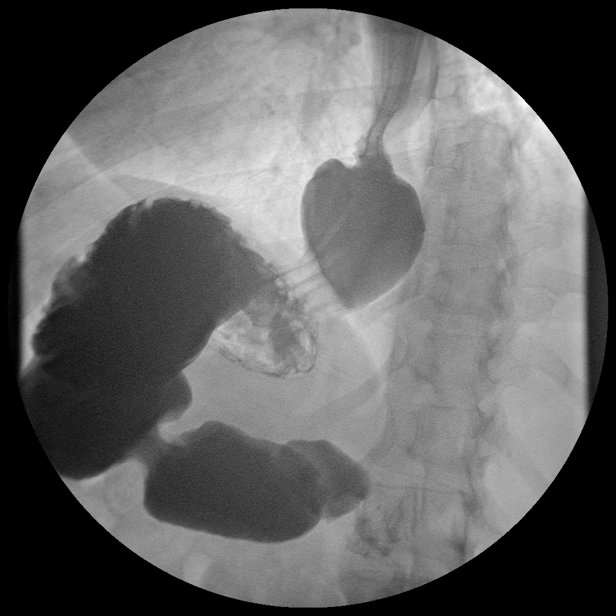

[run (4 of 10)]
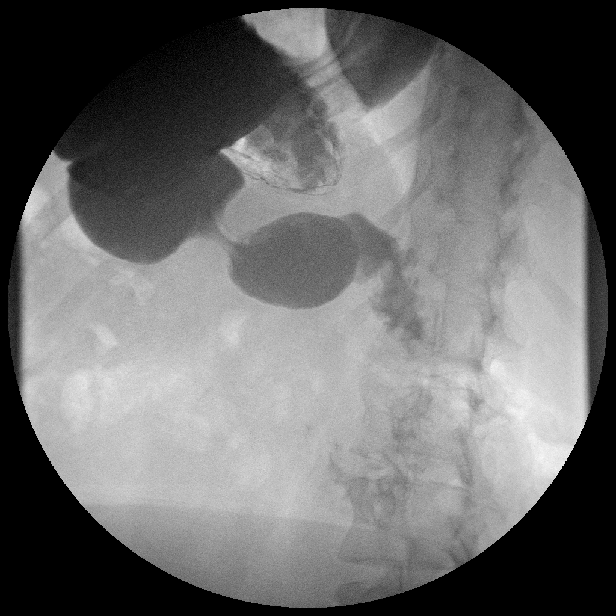

[run (5 of 10)]
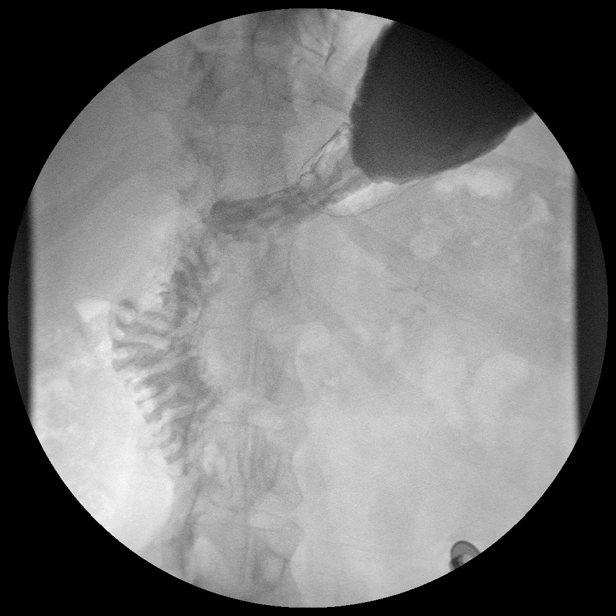

[run (6 of 10)]
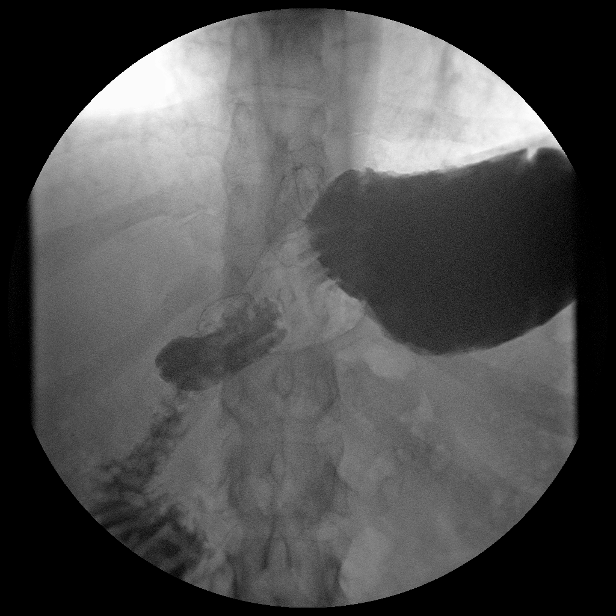

[run (7 of 10)]
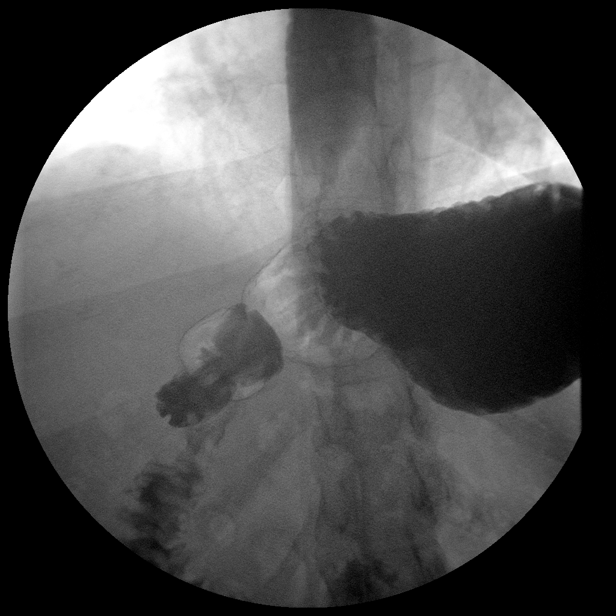

[run (8 of 10)]
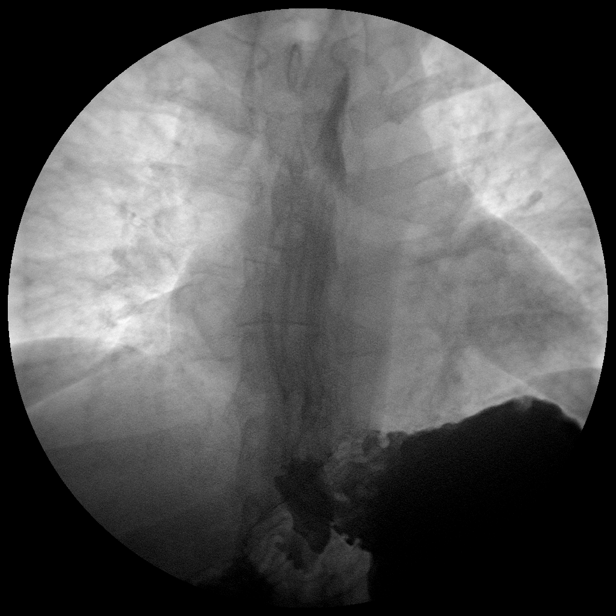

[run (9 of 10)]
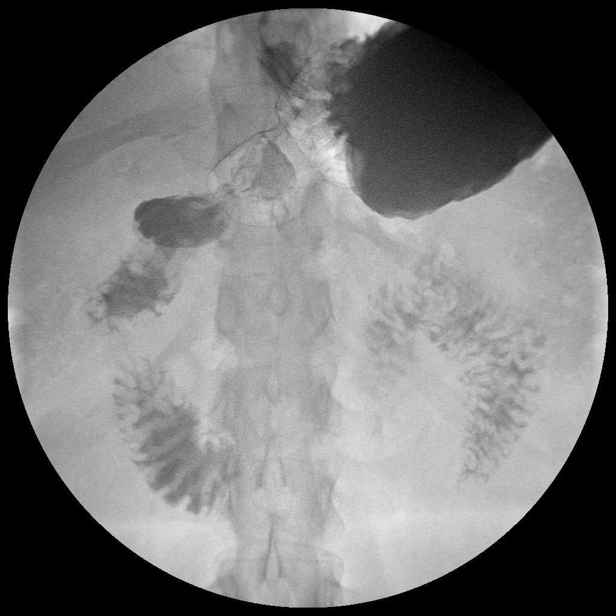

[run (10 of 10)]
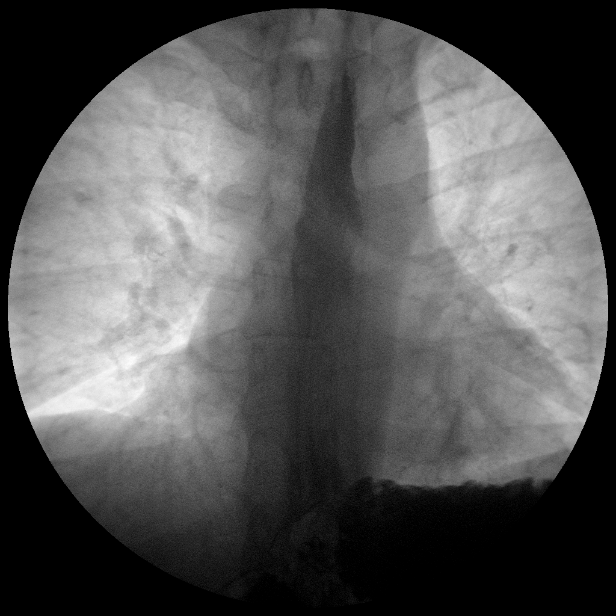

[view not recorded (1 of 5)]
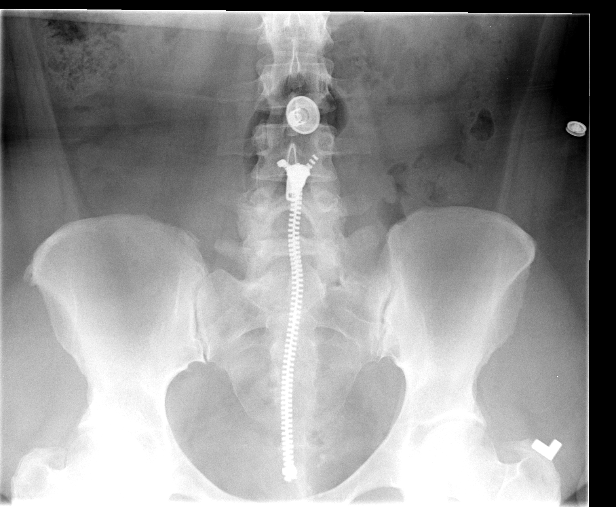

[view not recorded (2 of 5)]
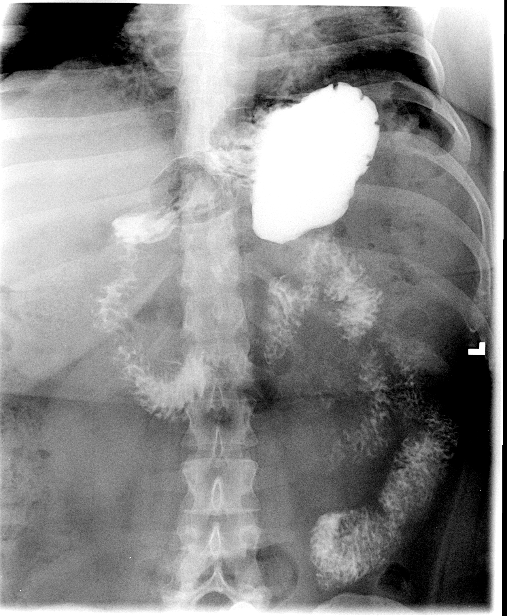

[view not recorded (3 of 5)]
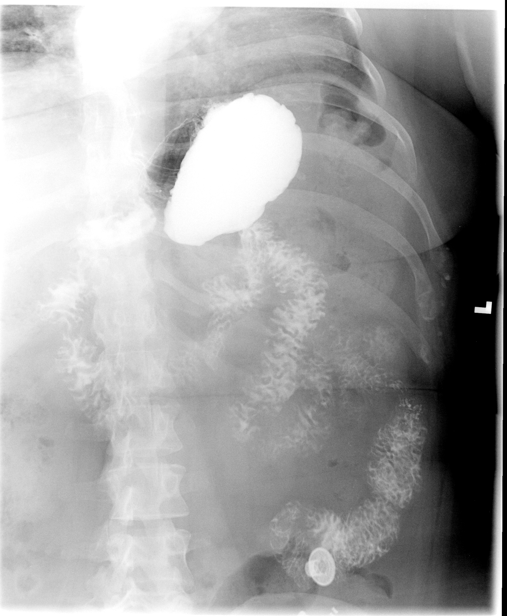

[view not recorded (4 of 5)]
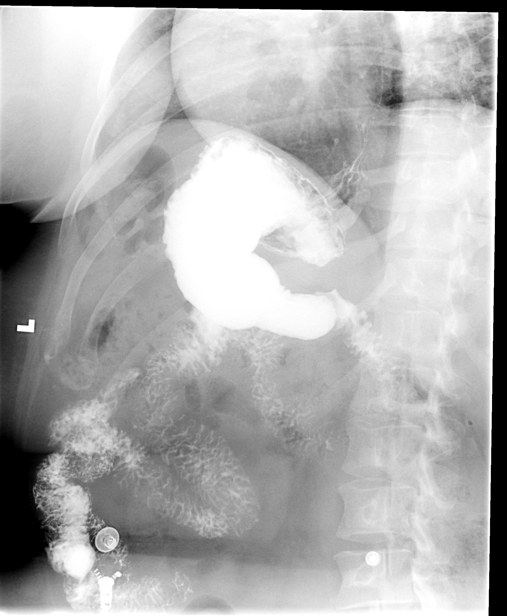

[view not recorded (5 of 5)]
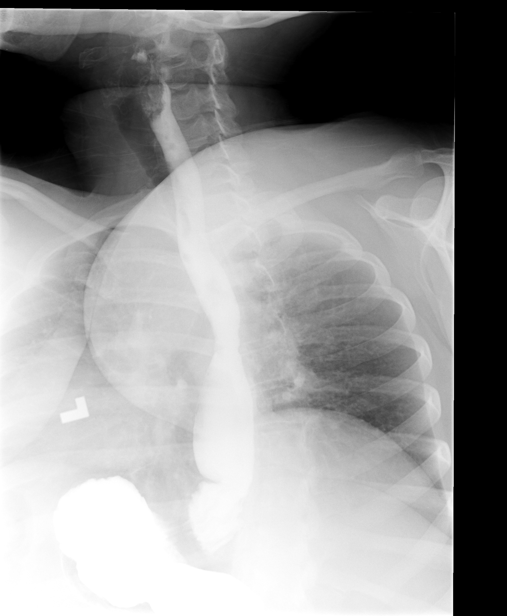

[15 of 20 positions shown; findings below may reference images not displayed]

FINDINGS: KUB:  A nonobstructive bowel gas pattern is seen.  No
abnormal calcifications or focal bony abnormalities are noted.

Upper GI:  The patient was able swallow barium without difficulty.
A small to moderate size sliding variety hiatal hernia was noted.
Associated gastroesophageal reflux to the high cervical esophagus
was noted and this demonstrated poor clearing with the patient in
the prone position.

Gastric mucosa and motility appeared within normal limits.  The
duodenal bulb was well distended and has a normal morphology.
Visualized small bowel to the level of the proximal jejunum
appeared normal.
IMPRESSION: Small moderate-sized sliding variety hiatal hernia with associated
gastroesophageal reflux which demonstrated poor clearing.
Otherwise negative.

## 2015-01-07 ENCOUNTER — Other Ambulatory Visit: Payer: Self-pay

## 2015-01-07 DIAGNOSIS — Z1231 Encounter for screening mammogram for malignant neoplasm of breast: Secondary | ICD-10-CM

## 2015-01-12 ENCOUNTER — Ambulatory Visit
Admission: RE | Admit: 2015-01-12 | Discharge: 2015-01-12 | Disposition: A | Discharge status: Home / Self Care | Payer: 59 | Source: Ambulatory Visit

## 2015-01-12 DIAGNOSIS — Z1231 Encounter for screening mammogram for malignant neoplasm of breast: Secondary | ICD-10-CM

## 2016-08-15 ENCOUNTER — Encounter (HOSPITAL_COMMUNITY): Payer: Self-pay

## 2017-08-06 ENCOUNTER — Encounter (HOSPITAL_COMMUNITY): Payer: Self-pay

## 2017-08-20 ENCOUNTER — Telehealth (HOSPITAL_COMMUNITY): Payer: Self-pay

## 2017-08-20 NOTE — Telephone Encounter (Signed)
Possible new address: [place], [place] [id number] This patient is overdue for recommended follow-up with a bariatric surgeon at [facility]. A letter was mailed to the address on file [phone] from both [place] in attempt to reestablish post-op care. Letter has been returned to [employer] marked undeliverable with note advising forwarding time has expired; return to sender. A new address listed in [place]. Sending letter out again today to newly suggested address on [address].
